# Patient Record
Sex: Female | Born: 1965 | Race: White | Hispanic: No | Smoking: Never smoker
Health system: Southern US, Community
[De-identification: ages and names within clinical notes are randomized; demographics above are authoritative.]

## PROBLEM LIST (undated history)

## (undated) DIAGNOSIS — F32A Depression, unspecified: Secondary | ICD-10-CM

## (undated) DIAGNOSIS — F410 Panic disorder [episodic paroxysmal anxiety] without agoraphobia: Secondary | ICD-10-CM

## (undated) DIAGNOSIS — F419 Anxiety disorder, unspecified: Secondary | ICD-10-CM

## (undated) DIAGNOSIS — F329 Major depressive disorder, single episode, unspecified: Secondary | ICD-10-CM

## (undated) DIAGNOSIS — J189 Pneumonia, unspecified organism: Secondary | ICD-10-CM

## (undated) DIAGNOSIS — H548 Legal blindness, as defined in USA: Secondary | ICD-10-CM

## (undated) DIAGNOSIS — I10 Essential (primary) hypertension: Secondary | ICD-10-CM

## (undated) HISTORY — PX: TUBAL LIGATION: SHX77

## (undated) HISTORY — DX: Panic disorder (episodic paroxysmal anxiety): F41.0

## (undated) HISTORY — DX: Anxiety disorder, unspecified: F41.9

## (undated) HISTORY — DX: Major depressive disorder, single episode, unspecified: F32.9

## (undated) HISTORY — DX: Essential (primary) hypertension: I10

## (undated) HISTORY — DX: Depression, unspecified: F32.A

## (undated) HISTORY — DX: Legal blindness, as defined in USA: H54.8

## (undated) HISTORY — PX: COLONOSCOPY: SHX174

---

## 1989-09-24 HISTORY — PX: TUBAL LIGATION: SHX77

## 2005-11-15 ENCOUNTER — Encounter: Admission: RE | Admit: 2005-11-15 | Discharge: 2005-11-15 | Payer: Self-pay | Admitting: Internal Medicine

## 2007-06-09 ENCOUNTER — Encounter: Admission: RE | Admit: 2007-06-09 | Discharge: 2007-06-09 | Payer: Self-pay | Admitting: Internal Medicine
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
## 2017-10-24 ENCOUNTER — Ambulatory Visit (INDEPENDENT_AMBULATORY_CARE_PROVIDER_SITE_OTHER): Payer: BLUE CROSS/BLUE SHIELD | Admitting: Psychiatry

## 2017-10-24 ENCOUNTER — Encounter (HOSPITAL_COMMUNITY): Payer: Self-pay | Admitting: Psychiatry

## 2017-10-24 ENCOUNTER — Encounter (INDEPENDENT_AMBULATORY_CARE_PROVIDER_SITE_OTHER): Payer: Self-pay

## 2017-10-24 VITALS — BP 122/84 | HR 79 | Ht 62.0 in | Wt 182.0 lb

## 2017-10-24 DIAGNOSIS — Z91419 Personal history of unspecified adult abuse: Secondary | ICD-10-CM | POA: Diagnosis not present

## 2017-10-24 DIAGNOSIS — F410 Panic disorder [episodic paroxysmal anxiety] without agoraphobia: Secondary | ICD-10-CM

## 2017-10-24 DIAGNOSIS — F411 Generalized anxiety disorder: Secondary | ICD-10-CM | POA: Diagnosis not present

## 2017-10-24 DIAGNOSIS — H359 Unspecified retinal disorder: Secondary | ICD-10-CM

## 2017-10-24 DIAGNOSIS — Z811 Family history of alcohol abuse and dependence: Secondary | ICD-10-CM

## 2017-10-24 DIAGNOSIS — I10 Essential (primary) hypertension: Secondary | ICD-10-CM | POA: Diagnosis not present

## 2017-10-24 DIAGNOSIS — F331 Major depressive disorder, recurrent, moderate: Secondary | ICD-10-CM

## 2017-10-24 DIAGNOSIS — Z79899 Other long term (current) drug therapy: Secondary | ICD-10-CM

## 2017-10-24 HISTORY — DX: Major depressive disorder, recurrent, moderate: F33.1

## 2017-10-24 HISTORY — DX: Panic disorder (episodic paroxysmal anxiety): F41.0

## 2017-10-24 HISTORY — DX: Generalized anxiety disorder: F41.1

## 2017-10-24 MED ORDER — DIAZEPAM 5 MG PO TABS: 5.0000 mg | ORAL_TABLET | Freq: Every day | ORAL | 0 refills | Status: DC | PRN

## 2017-10-24 MED ORDER — SERTRALINE HCL 25 MG PO TABS: ORAL_TABLET | ORAL | 0 refills | Status: DC

## 2017-10-24 NOTE — Patient Instructions (Signed)
1. Start sertraline 25 mg daily for one week, then 50 mg daily 2. Continue valium 5 mg daily as needed for anxiety  3. Return to clinic in one month for 30 mins 4. Referral to therapy

## 2017-11-11 ENCOUNTER — Other Ambulatory Visit (HOSPITAL_COMMUNITY): Payer: Self-pay | Admitting: Psychiatry

## 2017-11-11 ENCOUNTER — Telehealth (HOSPITAL_COMMUNITY): Payer: Self-pay | Admitting: *Deleted

## 2017-11-11 MED ORDER — SERTRALINE HCL 50 MG PO TABS: 50.0000 mg | ORAL_TABLET | Freq: Every day | ORAL | 0 refills | Status: DC

## 2017-11-11 NOTE — Telephone Encounter (Signed)
ordered

## 2017-11-11 NOTE — Telephone Encounter (Signed)
Dr Modesta Messing Patient called requesting refill on Zoloft 50 mg next appointment 11/21/17
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
## 2017-11-21 ENCOUNTER — Ambulatory Visit (INDEPENDENT_AMBULATORY_CARE_PROVIDER_SITE_OTHER): Payer: BLUE CROSS/BLUE SHIELD | Admitting: Psychiatry

## 2017-11-21 ENCOUNTER — Encounter (HOSPITAL_COMMUNITY): Payer: Self-pay | Admitting: Psychiatry

## 2017-11-21 VITALS — BP 138/86 | HR 79 | Ht 62.0 in | Wt 175.0 lb

## 2017-11-21 DIAGNOSIS — F411 Generalized anxiety disorder: Secondary | ICD-10-CM

## 2017-11-21 DIAGNOSIS — Z811 Family history of alcohol abuse and dependence: Secondary | ICD-10-CM | POA: Diagnosis not present

## 2017-11-21 DIAGNOSIS — G4700 Insomnia, unspecified: Secondary | ICD-10-CM

## 2017-11-21 DIAGNOSIS — F410 Panic disorder [episodic paroxysmal anxiety] without agoraphobia: Secondary | ICD-10-CM | POA: Diagnosis not present

## 2017-11-21 DIAGNOSIS — R450 Nervousness: Secondary | ICD-10-CM

## 2017-11-21 DIAGNOSIS — F331 Major depressive disorder, recurrent, moderate: Secondary | ICD-10-CM | POA: Diagnosis not present

## 2017-11-21 MED ORDER — SERTRALINE HCL 100 MG PO TABS: 100.0000 mg | ORAL_TABLET | Freq: Every day | ORAL | 0 refills | Status: DC

## 2017-11-21 NOTE — Patient Instructions (Signed)
1. Increase sertraline 100 mg daily  2. Continue valium 5 mg daily as needed for anxiety 3. Return to clinic in one month for 30 mins

## 2017-11-22 ENCOUNTER — Ambulatory Visit (HOSPITAL_COMMUNITY): Payer: Self-pay | Admitting: Psychiatry

## 2017-12-04 ENCOUNTER — Encounter (HOSPITAL_COMMUNITY): Payer: Self-pay | Admitting: Psychiatry

## 2017-12-04 ENCOUNTER — Ambulatory Visit (INDEPENDENT_AMBULATORY_CARE_PROVIDER_SITE_OTHER): Payer: BLUE CROSS/BLUE SHIELD | Admitting: Psychiatry

## 2017-12-04 DIAGNOSIS — F411 Generalized anxiety disorder: Secondary | ICD-10-CM | POA: Diagnosis not present

## 2017-12-05 ENCOUNTER — Encounter (HOSPITAL_COMMUNITY): Payer: Self-pay | Admitting: Psychiatry
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
## 2017-12-11 ENCOUNTER — Ambulatory Visit (INDEPENDENT_AMBULATORY_CARE_PROVIDER_SITE_OTHER): Payer: BLUE CROSS/BLUE SHIELD | Admitting: Psychiatry

## 2017-12-11 ENCOUNTER — Encounter (HOSPITAL_COMMUNITY): Payer: Self-pay | Admitting: Psychiatry

## 2017-12-11 DIAGNOSIS — F411 Generalized anxiety disorder: Secondary | ICD-10-CM
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
## 2018-01-07 ENCOUNTER — Telehealth (HOSPITAL_COMMUNITY): Payer: Self-pay | Admitting: *Deleted

## 2018-01-07 ENCOUNTER — Other Ambulatory Visit (HOSPITAL_COMMUNITY): Payer: Self-pay | Admitting: Psychiatry

## 2018-01-07 MED ORDER — SERTRALINE HCL 100 MG PO TABS: 100.0000 mg | ORAL_TABLET | Freq: Every day | ORAL | 0 refills | Status: DC

## 2018-01-07 MED ORDER — SERTRALINE HCL 100 MG PO TABS
100.0000 mg | ORAL_TABLET | Freq: Every day | ORAL | 0 refills | Status: DC
Start: 2018-01-07 — End: 2018-02-03

## 2018-01-07 NOTE — Telephone Encounter (Signed)
Dr Harrington Challenger Dr Modesta Messing patient requesting refill on Zoloft. 2 appointment therapy with Peggy 01/09/18 & 01/22/18

## 2018-01-07 NOTE — Telephone Encounter (Signed)
sent 

## 2018-01-09 ENCOUNTER — Ambulatory Visit (INDEPENDENT_AMBULATORY_CARE_PROVIDER_SITE_OTHER): Payer: BLUE CROSS/BLUE SHIELD | Admitting: Psychiatry

## 2018-01-09 ENCOUNTER — Encounter (HOSPITAL_COMMUNITY): Payer: Self-pay | Admitting: Psychiatry

## 2018-01-09 DIAGNOSIS — F331 Major depressive disorder, recurrent, moderate: Secondary | ICD-10-CM

## 2018-01-09 DIAGNOSIS — F411 Generalized anxiety disorder: Secondary | ICD-10-CM
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
## 2018-01-22 ENCOUNTER — Ambulatory Visit (HOSPITAL_COMMUNITY): Payer: BLUE CROSS/BLUE SHIELD | Admitting: Psychiatry
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
## 2018-02-03 ENCOUNTER — Ambulatory Visit (INDEPENDENT_AMBULATORY_CARE_PROVIDER_SITE_OTHER): Payer: Self-pay | Admitting: Psychiatry

## 2018-02-03 VITALS — BP 117/78 | HR 76 | Ht 62.0 in | Wt 180.0 lb

## 2018-02-03 DIAGNOSIS — Z811 Family history of alcohol abuse and dependence: Secondary | ICD-10-CM

## 2018-02-03 DIAGNOSIS — R450 Nervousness: Secondary | ICD-10-CM

## 2018-02-03 DIAGNOSIS — G4700 Insomnia, unspecified: Secondary | ICD-10-CM

## 2018-02-03 DIAGNOSIS — Z91411 Personal history of adult psychological abuse: Secondary | ICD-10-CM

## 2018-02-03 DIAGNOSIS — F410 Panic disorder [episodic paroxysmal anxiety] without agoraphobia: Secondary | ICD-10-CM

## 2018-02-03 DIAGNOSIS — G4710 Hypersomnia, unspecified: Secondary | ICD-10-CM

## 2018-02-03 DIAGNOSIS — F411 Generalized anxiety disorder: Secondary | ICD-10-CM

## 2018-02-03 DIAGNOSIS — F331 Major depressive disorder, recurrent, moderate: Secondary | ICD-10-CM

## 2018-02-03 DIAGNOSIS — Z818 Family history of other mental and behavioral disorders: Secondary | ICD-10-CM

## 2018-02-03 MED ORDER — SERTRALINE HCL 100 MG PO TABS: 150.0000 mg | ORAL_TABLET | Freq: Every day | ORAL | 0 refills | Status: DC

## 2018-02-03 NOTE — Patient Instructions (Signed)
1. Increase sertraline 150 mg daily  2. Return to clinic in one month for 30 mins 

## 2018-02-28 ENCOUNTER — Ambulatory Visit (HOSPITAL_COMMUNITY): Payer: Self-pay | Admitting: Psychiatry
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
## 2018-03-06 ENCOUNTER — Ambulatory Visit (INDEPENDENT_AMBULATORY_CARE_PROVIDER_SITE_OTHER): Payer: Self-pay | Admitting: Psychiatry

## 2018-03-06 VITALS — BP 141/85 | HR 97 | Ht 62.0 in | Wt 178.0 lb

## 2018-03-06 DIAGNOSIS — F331 Major depressive disorder, recurrent, moderate: Secondary | ICD-10-CM

## 2018-03-06 DIAGNOSIS — F410 Panic disorder [episodic paroxysmal anxiety] without agoraphobia: Secondary | ICD-10-CM

## 2018-03-06 DIAGNOSIS — F411 Generalized anxiety disorder: Secondary | ICD-10-CM

## 2018-03-06 MED ORDER — SERTRALINE HCL 100 MG PO TABS: 150.0000 mg | ORAL_TABLET | Freq: Every day | ORAL | 0 refills | Status: DC

## 2018-03-06 NOTE — Patient Instructions (Signed)
1. Continue sertraline 150 mg daily  2. Return to clinic in three months for 30 mins

## 2018-03-12 ENCOUNTER — Ambulatory Visit (HOSPITAL_COMMUNITY): Payer: Self-pay | Admitting: Psychiatry

## 2018-03-26 ENCOUNTER — Ambulatory Visit (INDEPENDENT_AMBULATORY_CARE_PROVIDER_SITE_OTHER): Payer: Self-pay | Admitting: Psychiatry

## 2018-03-26 DIAGNOSIS — F411 Generalized anxiety disorder: Secondary | ICD-10-CM

## 2018-03-26 DIAGNOSIS — F331 Major depressive disorder, recurrent, moderate: Secondary | ICD-10-CM
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
## 2018-04-09 ENCOUNTER — Ambulatory Visit (INDEPENDENT_AMBULATORY_CARE_PROVIDER_SITE_OTHER): Payer: Self-pay | Admitting: Psychiatry

## 2018-04-09 DIAGNOSIS — F411 Generalized anxiety disorder: Secondary | ICD-10-CM
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
## 2018-05-23 ENCOUNTER — Ambulatory Visit (INDEPENDENT_AMBULATORY_CARE_PROVIDER_SITE_OTHER): Payer: Self-pay | Admitting: Psychiatry

## 2018-05-23 DIAGNOSIS — F411 Generalized anxiety disorder: Secondary | ICD-10-CM

## 2018-05-23 DIAGNOSIS — F331 Major depressive disorder, recurrent, moderate: Secondary | ICD-10-CM
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
## 2018-05-27 ENCOUNTER — Ambulatory Visit (HOSPITAL_COMMUNITY): Payer: Self-pay | Admitting: Psychiatry

## 2018-06-04 ENCOUNTER — Ambulatory Visit (INDEPENDENT_AMBULATORY_CARE_PROVIDER_SITE_OTHER): Payer: Self-pay | Admitting: Psychiatry

## 2018-06-04 DIAGNOSIS — F411 Generalized anxiety disorder: Secondary | ICD-10-CM

## 2018-06-04 DIAGNOSIS — F331 Major depressive disorder, recurrent, moderate: Secondary | ICD-10-CM
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
## 2018-06-10 ENCOUNTER — Ambulatory Visit (HOSPITAL_COMMUNITY): Payer: Self-pay | Admitting: Psychiatry
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
## 2018-06-17 ENCOUNTER — Ambulatory Visit (HOSPITAL_COMMUNITY): Payer: Self-pay | Admitting: Psychiatry

## 2018-06-24 ENCOUNTER — Encounter (HOSPITAL_COMMUNITY): Payer: Self-pay | Admitting: Psychiatry

## 2018-06-24 ENCOUNTER — Ambulatory Visit (INDEPENDENT_AMBULATORY_CARE_PROVIDER_SITE_OTHER): Payer: Self-pay | Admitting: Psychiatry

## 2018-06-24 VITALS — BP 113/77 | HR 103 | Ht 62.0 in | Wt 186.0 lb

## 2018-06-24 DIAGNOSIS — F411 Generalized anxiety disorder: Secondary | ICD-10-CM

## 2018-06-24 DIAGNOSIS — F331 Major depressive disorder, recurrent, moderate: Secondary | ICD-10-CM

## 2018-06-24 DIAGNOSIS — F410 Panic disorder [episodic paroxysmal anxiety] without agoraphobia: Secondary | ICD-10-CM

## 2018-06-24 MED ORDER — SERTRALINE HCL 100 MG PO TABS: 200.0000 mg | ORAL_TABLET | Freq: Every day | ORAL | 0 refills | Status: DC

## 2018-06-24 NOTE — Patient Instructions (Signed)
1. Increase sertraline 200 mg daily  2. Return to clinic in two months for 30 mins
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
## 2018-07-04 ENCOUNTER — Ambulatory Visit (HOSPITAL_COMMUNITY): Payer: Self-pay | Admitting: Psychiatry
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
## 2018-07-31 ENCOUNTER — Ambulatory Visit (INDEPENDENT_AMBULATORY_CARE_PROVIDER_SITE_OTHER): Payer: Self-pay | Admitting: Psychiatry

## 2018-07-31 VITALS — BP 142/81 | HR 91 | Ht 62.0 in | Wt 181.0 lb

## 2018-07-31 DIAGNOSIS — F331 Major depressive disorder, recurrent, moderate: Secondary | ICD-10-CM

## 2018-07-31 NOTE — Patient Instructions (Addendum)
1. Continue sertraline 200 mg daily  2. Return to clinic in one month for 30 mins

## 2018-08-08 ENCOUNTER — Ambulatory Visit (INDEPENDENT_AMBULATORY_CARE_PROVIDER_SITE_OTHER): Payer: Self-pay | Admitting: Psychiatry

## 2018-08-08 ENCOUNTER — Encounter (HOSPITAL_COMMUNITY): Payer: Self-pay | Admitting: Psychiatry

## 2018-08-08 DIAGNOSIS — F331 Major depressive disorder, recurrent, moderate: Secondary | ICD-10-CM

## 2018-08-08 DIAGNOSIS — F411 Generalized anxiety disorder: Secondary | ICD-10-CM
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
## 2018-08-26 ENCOUNTER — Ambulatory Visit (HOSPITAL_COMMUNITY): Payer: Self-pay | Admitting: Psychiatry

## 2018-08-27 ENCOUNTER — Ambulatory Visit (HOSPITAL_COMMUNITY): Payer: Self-pay | Admitting: Psychiatry

## 2018-08-28 ENCOUNTER — Telehealth (HOSPITAL_COMMUNITY): Payer: Self-pay | Admitting: Psychiatry
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
## 2018-08-29 ENCOUNTER — Encounter (HOSPITAL_COMMUNITY): Payer: Self-pay | Admitting: Psychiatry

## 2018-08-29 ENCOUNTER — Ambulatory Visit (INDEPENDENT_AMBULATORY_CARE_PROVIDER_SITE_OTHER): Payer: Self-pay | Admitting: Psychiatry

## 2018-08-29 VITALS — BP 144/80 | HR 74 | Ht 62.0 in | Wt 182.0 lb

## 2018-08-29 DIAGNOSIS — F331 Major depressive disorder, recurrent, moderate: Secondary | ICD-10-CM

## 2018-08-29 DIAGNOSIS — F410 Panic disorder [episodic paroxysmal anxiety] without agoraphobia: Secondary | ICD-10-CM

## 2018-08-29 DIAGNOSIS — G4700 Insomnia, unspecified: Secondary | ICD-10-CM

## 2018-08-29 DIAGNOSIS — F419 Anxiety disorder, unspecified: Secondary | ICD-10-CM

## 2018-08-29 MED ORDER — SERTRALINE HCL 100 MG PO TABS: 200.0000 mg | ORAL_TABLET | Freq: Every day | ORAL | 0 refills | Status: DC

## 2018-08-29 NOTE — Patient Instructions (Signed)
1. Continue sertraline 200 mg daily  2.Return to clinic in three months for 30 mins

## 2018-09-03 ENCOUNTER — Telehealth (HOSPITAL_COMMUNITY): Payer: Self-pay | Admitting: Psychiatry

## 2018-09-03 NOTE — Telephone Encounter (Signed)
Could you contact the patient, and inform her about [facility], or [facility] in [place] for potential therapy (they might use sliding scale).

## 2018-09-03 NOTE — Telephone Encounter (Signed)
Patient mentioned to me that she may use her PCP to order her med's due to insurance issues

## 2018-09-03 NOTE — Telephone Encounter (Signed)
Noted, thanks!

## 2018-09-10 ENCOUNTER — Ambulatory Visit (INDEPENDENT_AMBULATORY_CARE_PROVIDER_SITE_OTHER): Payer: Self-pay | Admitting: Psychiatry

## 2018-09-10 DIAGNOSIS — F411 Generalized anxiety disorder: Secondary | ICD-10-CM

## 2018-09-10 DIAGNOSIS — F331 Major depressive disorder, recurrent, moderate: Secondary | ICD-10-CM
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
## 2018-10-01 ENCOUNTER — Ambulatory Visit (HOSPITAL_COMMUNITY): Payer: Self-pay | Admitting: Psychiatry

## 2018-10-15 ENCOUNTER — Ambulatory Visit (HOSPITAL_COMMUNITY): Payer: Self-pay | Admitting: Psychiatry

## 2018-10-29 ENCOUNTER — Ambulatory Visit (HOSPITAL_COMMUNITY): Payer: Self-pay | Admitting: Psychiatry

## 2018-11-04 ENCOUNTER — Encounter (HOSPITAL_COMMUNITY): Payer: Self-pay | Admitting: Psychiatry

## 2018-11-12 ENCOUNTER — Ambulatory Visit (HOSPITAL_COMMUNITY): Payer: Self-pay | Admitting: Psychiatry
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
## 2018-11-28 ENCOUNTER — Ambulatory Visit (HOSPITAL_COMMUNITY): Payer: Self-pay | Admitting: Psychiatry

## 2019-07-22 ENCOUNTER — Other Ambulatory Visit: Payer: Self-pay | Admitting: *Deleted

## 2019-07-22 DIAGNOSIS — Z20822 Contact with and (suspected) exposure to covid-19: Secondary | ICD-10-CM

## 2019-07-23 LAB — NOVEL CORONAVIRUS, NAA: SARS-CoV-2, NAA: NOT DETECTED

## 2019-07-24 ENCOUNTER — Telehealth: Payer: Self-pay | Admitting: Internal Medicine

## 2019-07-24 NOTE — Telephone Encounter (Signed)
Patient is calling to receive her negative COVID test results. Patient expressed understanding. 

## 2020-02-04 ENCOUNTER — Ambulatory Visit
Admission: EM | Admit: 2020-02-04 | Discharge: 2020-02-04 | Disposition: A | Discharge status: Home / Self Care | Payer: Self-pay

## 2020-02-04 ENCOUNTER — Other Ambulatory Visit: Payer: Self-pay

## 2020-02-04 DIAGNOSIS — Z4802 Encounter for removal of sutures: Secondary | ICD-10-CM

## 2020-02-04 NOTE — ED Triage Notes (Signed)
Pt here for stitch removal from right eye. One suture removed

## 2020-05-09 ENCOUNTER — Other Ambulatory Visit: Payer: Self-pay

## 2020-05-09 ENCOUNTER — Ambulatory Visit (INDEPENDENT_AMBULATORY_CARE_PROVIDER_SITE_OTHER): Payer: Medicaid Other

## 2020-05-09 ENCOUNTER — Ambulatory Visit
Admission: EM | Admit: 2020-05-09 | Discharge: 2020-05-09 | Disposition: A | Discharge status: Home / Self Care | Payer: Medicaid Other

## 2020-05-09 DIAGNOSIS — R05 Cough: Secondary | ICD-10-CM | POA: Diagnosis not present

## 2020-05-09 DIAGNOSIS — R059 Cough, unspecified: Secondary | ICD-10-CM

## 2020-05-09 DIAGNOSIS — J189 Pneumonia, unspecified organism: Secondary | ICD-10-CM

## 2020-05-09 DIAGNOSIS — R9389 Abnormal findings on diagnostic imaging of other specified body structures: Secondary | ICD-10-CM

## 2020-05-09 MED ORDER — AMOXICILLIN 500 MG PO CAPS: 1000.0000 mg | ORAL_CAPSULE | Freq: Three times a day (TID) | ORAL | 0 refills | Status: DC

## 2020-05-09 MED ORDER — AZITHROMYCIN 250 MG PO TABS: 250.0000 mg | ORAL_TABLET | Freq: Every day | ORAL | 0 refills | Status: DC

## 2020-05-09 NOTE — Discharge Instructions (Addendum)
Unable to rule out cardiac disease or blood clot in urgent care setting.  Offered patient further evaluation and management in the ED.  Patient declines at this time and would like to try outpatient therapy first.  Aware of the risk associated with this decision including missed diagnosis, organ damage, organ failure, and/or death.  Patient aware and in agreement.     X-rays showed a pneumonia Amoxicillin and azithromycin prescribed. Take as prescribed and to completion.   Follow up with PCP if symptoms persist Return or go to ER if you have any new or worsening symptoms fever, chills, nausea, vomiting, cough, chest pain, SOB, etc..Marland Kitchen

## 2020-05-09 NOTE — ED Triage Notes (Signed)
Pt presents with c/o cough for past 2 months , wants chest x ray. Pt also states she has had intermittent

## 2020-05-09 NOTE — ED Provider Notes (Signed)
[facility]   [id number] 05/09/20 Arrival Time: 1841   CC: CHEST PAIN  SUBJECTIVE:  Bonnie Warren is a 54 y.o. female who presents with complaint of stabbing LT sided chest pain x 1 day.  Reports chronic nonproductive cough x 2 months.  Chest pain is diffuse about the middle and left side.  Describes as worsening, that is intermittent (unable to quantified) and stabbing in character.  Denies alleviating factors.  Denies aggravating factors.  Denies radiating symptoms.  Reports previous symptoms in the past with pleurisy.  Denies fever, chills, lightheadedness, dizziness, palpitations, tachycardia, nausea, vomiting, abdominal pain, changes in bowel or bladder habits.  Patient is legally blind, has PTSD, and does not have adequate home care.    ROS: As per HPI.  All other pertinent ROS negative.    Past Medical History:  Diagnosis Date  . Anxiety   . Depression   . Hypertension   . Panic attacks    Past Surgical History:  Procedure Laterality Date  . TUBAL LIGATION     No Known Allergies No current facility-administered medications on file prior to encounter.   Current Outpatient Medications on File Prior to Encounter  Medication Sig Dispense Refill  . diazepam (VALIUM) 5 MG tablet Take 1 tablet (5 mg total) by mouth daily as needed for anxiety. 30 tablet 0  . escitalopram (LEXAPRO) 5 MG tablet Take 5 mg by mouth at bedtime.    Marland Kitchen LISINOPRIL PO Take by mouth.    . [DISCONTINUED] sertraline (ZOLOFT) 100 MG tablet Take 2 tablets (200 mg total) by mouth daily. 180 tablet 0   Social History   Socioeconomic History  . Marital status: Married    Spouse name: Not on file  . Number of children: Not on file  . Years of education: Not on file  . Highest education level: Not on file  Occupational History  . Not on file  Tobacco Use  . Smoking status: Never Smoker  . Smokeless tobacco: Never Used  Substance and Sexual Activity  . Alcohol use: Not on file    Comment:  occasional, social, every 3 months  . Drug use: No  . Sexual activity: Never  Other Topics Concern  . Not on file  Social History Narrative  . Not on file   Social Determinants of Health   Financial Resource Strain:   . Difficulty of Paying Living Expenses:   Food Insecurity:   . Worried About Charity fundraiser in the Last Year:   . Arboriculturist in the Last Year:   Transportation Needs:   . Film/video editor (Medical):   Marland Kitchen Lack of Transportation (Non-Medical):   Physical Activity:   . Days of Exercise per Week:   . Minutes of Exercise per Session:   Stress:   . Feeling of Stress :   Social Connections:   . Frequency of Communication with Friends and Family:   . Frequency of Social Gatherings with Friends and Family:   . Attends Religious Services:   . Active Member of Clubs or Organizations:   . Attends Archivist Meetings:   Marland Kitchen Marital Status:   Intimate Partner Violence:   . Fear of Current or Ex-Partner:   . Emotionally Abused:   Marland Kitchen Physically Abused:   . Sexually Abused:    Family History  Problem Relation Age of Onset  . Alcohol abuse Maternal Grandfather   . Depression Maternal Grandmother   . Hypertension Mother  OBJECTIVE:  Vitals:   05/09/20 1933  BP: 128/85  Pulse: 67  Resp: 18  Temp: 98.2 F (36.8 C)  SpO2: 98%    General appearance: alert; no distress Eyes: PERRLA; EOMI; conjunctiva normal HENT: normocephalic; atraumatic; EACs clear, TMs pearly gray; oropharynx clear Neck: supple Lungs: clear to auscultation bilaterally without adventitious breath sounds; difficult to assess due to patient talking Heart: regular rate and rhythm.   Extremities: no cyanosis or edema; symmetrical with no gross deformities Skin: warm and dry Psychological: alert and cooperative; anxious mood and affect; tearful during examination  DIAGNOSTIC STUDIES:   DG Chest 2 View  Result Date: 05/09/2020 CLINICAL DATA:  Cough for 2 months. EXAM:  CHEST - 2 VIEW COMPARISON:  None. FINDINGS: The cardiomediastinal contours are normal. There is peribronchial thickening. Questionable vague patchy opacity in the left mid lung likely localized to the lingula on the lateral view. Pulmonary vasculature is normal. No pleural effusion or pneumothorax. No acute osseous abnormalities are seen. IMPRESSION: 1. Questionable vague patchy opacity in the left mid lung, likely localized to the lingula on the lateral view, may reflect pneumonia. Followup PA and lateral chest X-ray is recommended in 3-4 weeks after course of treatment to ensure resolution and exclude underlying malignancy. 2. Peribronchial thickening suggesting bronchitis or asthma. Electronically Signed   By: Keith Rake M.D.   On: 05/09/2020 19:50    X-rays concerning for pneumonia  I have reviewed the x-rays myself and the radiologist interpretation. I am in agreement with the radiologist interpretation.     ASSESSMENT & PLAN:  1. Cough   2. Abnormal chest x-ray   3. Pneumonia of left lung due to infectious organism, unspecified part of lung     Meds ordered this encounter  Medications  . amoxicillin (AMOXIL) 500 MG capsule    Sig: Take 2 capsules (1,000 mg total) by mouth 3 (three) times daily for 10 days.    Dispense:  60 capsule    Refill:  0    Order Specific Question:   Supervising Provider    Answer:   Raylene Everts [[id number]]  . azithromycin (ZITHROMAX) 250 MG tablet    Sig: Take 1 tablet (250 mg total) by mouth daily. Take first 2 tablets together, then 1 every day until finished.    Dispense:  6 tablet    Refill:  0    Order Specific Question:   Supervising Provider    Answer:   Raylene Everts [[id number]]   Unable to rule out cardiac disease or blood clot in urgent care setting.  Offered patient further evaluation and management in the ED.  Patient declines at this time and would like to try outpatient therapy first.  Aware of the risk associated with this  decision including missed diagnosis, organ damage, organ failure, and/or death.  Patient aware and in agreement.     X-rays showed a pneumonia Amoxicillin and azithromycin prescribed. Take as prescribed and to completion.   Follow up with PCP if symptoms persist Return or go to ER if you have any new or worsening symptoms fever, chills, nausea, vomiting, cough, chest pain, SOB, etc...    Lestine Box, PA-C 05/09/20 2014

## 2020-05-10 ENCOUNTER — Telehealth: Payer: Self-pay

## 2020-05-10 MED ORDER — AZITHROMYCIN 250 MG PO TABS: 250.0000 mg | ORAL_TABLET | Freq: Every day | ORAL | 0 refills | Status: DC

## 2020-05-10 MED ORDER — AMOXICILLIN 500 MG PO CAPS: 1000.0000 mg | ORAL_CAPSULE | Freq: Three times a day (TID) | ORAL | 0 refills | Status: AC

## 2020-05-11 ENCOUNTER — Ambulatory Visit
Admission: EM | Admit: 2020-05-11 | Discharge: 2020-05-11 | Disposition: A | Discharge status: Home / Self Care | Payer: Medicaid Other | Attending: Emergency Medicine | Admitting: Emergency Medicine

## 2020-05-11 ENCOUNTER — Other Ambulatory Visit: Payer: Self-pay

## 2020-05-11 DIAGNOSIS — Z1152 Encounter for screening for COVID-19: Secondary | ICD-10-CM

## 2020-05-11 NOTE — ED Triage Notes (Signed)
Pt only wants covid test , pt recently seen and treated for pneumonia

## 2020-05-12 ENCOUNTER — Other Ambulatory Visit: Payer: Self-pay | Admitting: Internal Medicine

## 2020-05-12 ENCOUNTER — Other Ambulatory Visit: Payer: Self-pay | Admitting: Nurse Practitioner

## 2020-05-12 ENCOUNTER — Ambulatory Visit
Admission: RE | Admit: 2020-05-12 | Discharge: 2020-05-12 | Disposition: A | Discharge status: Home / Self Care | Payer: Medicaid Other | Source: Ambulatory Visit | Attending: Nurse Practitioner | Admitting: Nurse Practitioner

## 2020-05-12 DIAGNOSIS — Z1231 Encounter for screening mammogram for malignant neoplasm of breast: Secondary | ICD-10-CM

## 2020-05-12 LAB — SARS-COV-2, NAA 2 DAY TAT

## 2020-05-12 LAB — NOVEL CORONAVIRUS, NAA: SARS-CoV-2, NAA: NOT DETECTED

## 2020-05-14 ENCOUNTER — Telehealth: Payer: Self-pay | Admitting: Family Medicine

## 2020-05-14 MED ORDER — FLUCONAZOLE 150 MG PO TABS: 150.0000 mg | ORAL_TABLET | Freq: Every day | ORAL | 0 refills | Status: DC

## 2020-05-14 MED ORDER — AZITHROMYCIN 250 MG PO TABS: ORAL_TABLET | ORAL | 0 refills | Status: DC

## 2020-05-14 NOTE — Telephone Encounter (Signed)
Resending z pac due to her loosing it.  Diflucan for yeast infection.

## 2020-05-16 ENCOUNTER — Other Ambulatory Visit: Payer: Self-pay | Admitting: Nurse Practitioner

## 2020-05-16 DIAGNOSIS — R928 Other abnormal and inconclusive findings on diagnostic imaging of breast: Secondary | ICD-10-CM

## 2020-05-18 ENCOUNTER — Other Ambulatory Visit: Payer: Self-pay | Admitting: Nurse Practitioner

## 2020-05-18 ENCOUNTER — Other Ambulatory Visit: Payer: Self-pay

## 2020-05-18 ENCOUNTER — Ambulatory Visit
Admission: RE | Admit: 2020-05-18 | Discharge: 2020-05-18 | Disposition: A | Discharge status: Home / Self Care | Payer: Medicaid Other | Source: Ambulatory Visit | Attending: Nurse Practitioner | Admitting: Nurse Practitioner

## 2020-05-18 DIAGNOSIS — R928 Other abnormal and inconclusive findings on diagnostic imaging of breast: Secondary | ICD-10-CM

## 2020-05-26 ENCOUNTER — Ambulatory Visit
Admission: RE | Admit: 2020-05-26 | Discharge: 2020-05-26 | Disposition: A | Discharge status: Home / Self Care | Payer: Medicaid Other | Source: Ambulatory Visit | Attending: Nurse Practitioner | Admitting: Nurse Practitioner

## 2020-05-26 ENCOUNTER — Other Ambulatory Visit: Payer: Self-pay

## 2020-05-26 DIAGNOSIS — R928 Other abnormal and inconclusive findings on diagnostic imaging of breast: Secondary | ICD-10-CM

## 2020-06-07 DIAGNOSIS — Z1211 Encounter for screening for malignant neoplasm of colon: Secondary | ICD-10-CM | POA: Insufficient documentation

## 2020-06-29 DIAGNOSIS — D122 Benign neoplasm of ascending colon: Secondary | ICD-10-CM | POA: Insufficient documentation

## 2020-07-11 ENCOUNTER — Ambulatory Visit
Admission: EM | Admit: 2020-07-11 | Discharge: 2020-07-11 | Disposition: A | Discharge status: Home / Self Care | Payer: Medicaid Other | Attending: Emergency Medicine | Admitting: Emergency Medicine

## 2020-07-11 ENCOUNTER — Other Ambulatory Visit: Payer: Self-pay

## 2020-07-11 ENCOUNTER — Encounter: Payer: Self-pay | Admitting: Emergency Medicine

## 2020-07-11 DIAGNOSIS — H5789 Other specified disorders of eye and adnexa: Secondary | ICD-10-CM | POA: Diagnosis not present

## 2020-07-11 DIAGNOSIS — H65193 Other acute nonsuppurative otitis media, bilateral: Secondary | ICD-10-CM

## 2020-07-11 MED ORDER — FLUTICASONE PROPIONATE 50 MCG/ACT NA SUSP: 1.0000 | Freq: Every day | NASAL | 0 refills | Status: DC

## 2020-07-11 MED ORDER — OFLOXACIN 0.3 % OP SOLN: 1.0000 [drp] | Freq: Four times a day (QID) | OPHTHALMIC | 0 refills | Status: DC

## 2020-07-11 MED ORDER — FLUTICASONE PROPIONATE 50 MCG/ACT NA SUSP: 1.0000 | Freq: Every day | NASAL | 0 refills | Status: AC

## 2020-07-11 NOTE — ED Triage Notes (Signed)
Observations: Patient states that she has busted blood vessel in right eye and ear pain fullness. no cough. says she needs eye antibiotics.

## 2020-07-11 NOTE — ED Provider Notes (Signed)
[facility]   [id number] 07/11/20 Arrival Time: 54  CC: Red eye  SUBJECTIVE:  Bonnie Warren is a 54 y.o. female who presented to the urgent care with a complaint of right eye redness for the past few days.  Denies a precipitating event, trauma, or close contacts with similar symptoms.  Has tried OTC eye drops Systane without relief.  Denies aggravating factors.  Denies similar symptoms in the past.  Denies fever, chills, nausea, vomiting, eye pain, painful eye movements, halos, discharge, itching, vision changes, double vision, FB sensation, periorbital erythema.     Denies contact lens use.    ROS: As per HPI.  All other pertinent ROS negative.     Past Medical History:  Diagnosis Date  . Anxiety   . Depression   . Hypertension   . Panic attacks    Past Surgical History:  Procedure Laterality Date  . TUBAL LIGATION     No Known Allergies No current facility-administered medications on file prior to encounter.   Current Outpatient Medications on File Prior to Encounter  Medication Sig Dispense Refill  . azithromycin (ZITHROMAX Z-PAK) 250 MG tablet As specified on pack. 6 tablet 0  . diazepam (VALIUM) 5 MG tablet Take 1 tablet (5 mg total) by mouth daily as needed for anxiety. 30 tablet 0  . escitalopram (LEXAPRO) 5 MG tablet Take 5 mg by mouth at bedtime.    . fluconazole (DIFLUCAN) 150 MG tablet Take 1 tablet (150 mg total) by mouth daily. 2 tablet 0  . LISINOPRIL PO Take by mouth.    . [DISCONTINUED] sertraline (ZOLOFT) 100 MG tablet Take 2 tablets (200 mg total) by mouth daily. 180 tablet 0   Social History   Socioeconomic History  . Marital status: Married    Spouse name: Not on file  . Number of children: Not on file  . Years of education: Not on file  . Highest education level: Not on file  Occupational History  . Not on file  Tobacco Use  . Smoking status: Never Smoker  . Smokeless tobacco: Never Used  Substance and Sexual Activity  .  Alcohol use: Not on file    Comment: occasional, social, every 3 months  . Drug use: No  . Sexual activity: Never  Other Topics Concern  . Not on file  Social History Narrative  . Not on file   Social Determinants of Health   Financial Resource Strain:   . Difficulty of Paying Living Expenses: Not on file  Food Insecurity:   . Worried About Charity fundraiser in the Last Year: Not on file  . Ran Out of Food in the Last Year: Not on file  Transportation Needs:   . Lack of Transportation (Medical): Not on file  . Lack of Transportation (Non-Medical): Not on file  Physical Activity:   . Days of Exercise per Week: Not on file  . Minutes of Exercise per Session: Not on file  Stress:   . Feeling of Stress : Not on file  Social Connections:   . Frequency of Communication with Friends and Family: Not on file  . Frequency of Social Gatherings with Friends and Family: Not on file  . Attends Religious Services: Not on file  . Active Member of Clubs or Organizations: Not on file  . Attends Archivist Meetings: Not on file  . Marital Status: Not on file  Intimate Partner Violence:   . Fear of Current or Ex-Partner: Not on  file  . Emotionally Abused: Not on file  . Physically Abused: Not on file  . Sexually Abused: Not on file   Family History  Problem Relation Age of Onset  . Alcohol abuse Maternal Grandfather   . Depression Maternal Grandmother   . Hypertension Mother     OBJECTIVE:    Visual Acuity  Right Eye Distance:   Left Eye Distance:   Bilateral Distance:    Right Eye Near:   Left Eye Near:    Bilateral Near:      Vitals:   07/11/20 1551  BP: (!) 146/92  Pulse: 88  Resp: 16  Temp: 98.8 F (37.1 C)  SpO2: 98%    Physical Exam Vitals and nursing note reviewed.  Constitutional:      General: She is not in acute distress.    Appearance: Normal appearance. She is normal weight. She is not ill-appearing, toxic-appearing or diaphoretic.  HENT:      Head: Normocephalic.     Right Ear: Ear canal and external ear normal. A middle ear effusion is present.     Left Ear: Ear canal and external ear normal. A middle ear effusion is present.  Eyes:     General: Lids are normal. Lids are everted, no foreign bodies appreciated. Vision grossly intact. Gaze aligned appropriately. No allergic shiner or visual field deficit.       Right eye: No foreign body, discharge or hordeolum.        Left eye: No foreign body, discharge or hordeolum.     Comments: right eye redness  Cardiovascular:     Rate and Rhythm: Normal rate and regular rhythm.     Pulses: Normal pulses.     Heart sounds: Normal heart sounds. No murmur heard.  No friction rub. No gallop.   Pulmonary:     Effort: Pulmonary effort is normal. No respiratory distress.     Breath sounds: Normal breath sounds. No stridor. No wheezing, rhonchi or rales.  Chest:     Chest wall: No tenderness.  Neurological:     Mental Status: She is alert and oriented to person, place, and time.       ASSESSMENT & PLAN:  1. Eye redness   2. Acute middle ear effusion, bilateral     Meds ordered this encounter  Medications  . ofloxacin (OCUFLOX) 0.3 % ophthalmic solution    Sig: Place 1 drop into the right eye 4 (four) times daily.    Dispense:  5 mL    Refill:  0  . fluticasone (FLONASE) 50 MCG/ACT nasal spray    Sig: Place 1 spray into both nostrils daily for 14 days.    Dispense:  16 g    Refill:  0     Discharge instructions  Use ofloxacin as prescribed and to completion Use OTC systane eye drops as needed for symptomatic relief Use OTC ibuprofen or tylenol as needed for pain relief Flonase was prescribed for bilateral middle ear effusion Return here or follow up with ophthamolgy if symptoms persists or worsen such as fever, chills, redness, swelling, eye pain, painful eye movements, vision changes, etc...   Reviewed expectations re: course of current medical issues. Questions  answered. Outlined signs and symptoms indicating need for more acute intervention. Patient verbalized understanding. After Visit Summary given.   Emerson Monte, [place] 07/11/20 1627

## 2020-07-11 NOTE — Discharge Instructions (Signed)
Use ofloxacin as prescribed and to completion Use OTC systane eye drops as needed for symptomatic relief Use OTC ibuprofen or tylenol as needed for pain relief Flonase was prescribed for bilateral middle ear effusion Return here or follow up with ophthamolgy if symptoms persists or worsen such as fever, chills, redness, swelling, eye pain, painful eye movements, vision changes, etc..Marland Kitchen

## 2020-07-23 ENCOUNTER — Ambulatory Visit
Admission: EM | Admit: 2020-07-23 | Discharge: 2020-07-23 | Disposition: A | Discharge status: Home / Self Care | Payer: Medicaid Other | Attending: Emergency Medicine | Admitting: Emergency Medicine

## 2020-07-23 ENCOUNTER — Ambulatory Visit (INDEPENDENT_AMBULATORY_CARE_PROVIDER_SITE_OTHER): Payer: Medicaid Other

## 2020-07-23 ENCOUNTER — Other Ambulatory Visit: Payer: Self-pay

## 2020-07-23 DIAGNOSIS — M25561 Pain in right knee: Secondary | ICD-10-CM | POA: Diagnosis not present

## 2020-07-23 DIAGNOSIS — S8991XA Unspecified injury of right lower leg, initial encounter: Secondary | ICD-10-CM

## 2020-07-23 MED ORDER — MELOXICAM 7.5 MG PO TABS: 7.5000 mg | ORAL_TABLET | Freq: Every day | ORAL | 0 refills | Status: DC

## 2020-07-23 NOTE — Discharge Instructions (Signed)
Continue conservative management of rest, ice, and elevation Ace bandage applied Take mobic as needed for pain relief (may cause abdominal discomfort, ulcers, and GI bleeds avoid taking with other NSAIDs) Follow up with PCP or orthopedist for further evaluation and management Return or go to the ER if you have any new or worsening symptoms (fever, chills, chest pain, redness, swelling, bruising, etc...)

## 2020-07-23 NOTE — ED Triage Notes (Signed)
Pt presents with right knee pain from fall 3 weeks ago, pt had a laceration that has healed on front of knee but still having pain

## 2020-07-23 NOTE — ED Provider Notes (Signed)
[place]   [id number] 07/23/20 Arrival Time: 5374  CC: RT knee pain  SUBJECTIVE: History from: patient. Bonnie Warren is a 54 y.o. female complains of RT knee pain and injury x 3 weeks.  Fall and landed on RT knee and RLE.  Localizes the pain to the RT knee.  Has tried OTC medications without relief.  Symptoms are made worse with weight-bearing.  Complains of healing wound to knee.  Denies fever, chills, erythema, ecchymosis, effusion, weakness, numbness and tingling.    ROS: As per HPI.  All other pertinent ROS negative.     Past Medical History:  Diagnosis Date  . Anxiety   . Depression   . Hypertension   . Panic attacks    Past Surgical History:  Procedure Laterality Date  . TUBAL LIGATION     No Known Allergies No current facility-administered medications on file prior to encounter.   Current Outpatient Medications on File Prior to Encounter  Medication Sig Dispense Refill  . diazepam (VALIUM) 5 MG tablet Take 1 tablet (5 mg total) by mouth daily as needed for anxiety. 30 tablet 0  . escitalopram (LEXAPRO) 5 MG tablet Take 5 mg by mouth at bedtime.    . fluconazole (DIFLUCAN) 150 MG tablet Take 1 tablet (150 mg total) by mouth daily. 2 tablet 0  . fluticasone (FLONASE) 50 MCG/ACT nasal spray Place 1 spray into both nostrils daily for 14 days. 16 g 0  . LISINOPRIL PO Take by mouth.    Marland Kitchen ofloxacin (OCUFLOX) 0.3 % ophthalmic solution Place 1 drop into the right eye 4 (four) times daily. 5 mL 0  . [DISCONTINUED] sertraline (ZOLOFT) 100 MG tablet Take 2 tablets (200 mg total) by mouth daily. 180 tablet 0   Social History   Socioeconomic History  . Marital status: Married    Spouse name: Not on file  . Number of children: Not on file  . Years of education: Not on file  . Highest education level: Not on file  Occupational History  . Not on file  Tobacco Use  . Smoking status: Never Smoker  . Smokeless tobacco: Never Used  Substance and Sexual  Activity  . Alcohol use: Not on file    Comment: occasional, social, every 3 months  . Drug use: No  . Sexual activity: Never  Other Topics Concern  . Not on file  Social History Narrative  . Not on file   Social Determinants of Health   Financial Resource Strain:   . Difficulty of Paying Living Expenses: Not on file  Food Insecurity:   . Worried About Charity fundraiser in the Last Year: Not on file  . Ran Out of Food in the Last Year: Not on file  Transportation Needs:   . Lack of Transportation (Medical): Not on file  . Lack of Transportation (Non-Medical): Not on file  Physical Activity:   . Days of Exercise per Week: Not on file  . Minutes of Exercise per Session: Not on file  Stress:   . Feeling of Stress : Not on file  Social Connections:   . Frequency of Communication with Friends and Family: Not on file  . Frequency of Social Gatherings with Friends and Family: Not on file  . Attends Religious Services: Not on file  . Active Member of Clubs or Organizations: Not on file  . Attends Archivist Meetings: Not on file  . Marital Status: Not on file  Intimate Partner Violence:   .  Fear of Current or Ex-Partner: Not on file  . Emotionally Abused: Not on file  . Physically Abused: Not on file  . Sexually Abused: Not on file   Family History  Problem Relation Age of Onset  . Alcohol abuse Maternal Grandfather   . Depression Maternal Grandmother   . Hypertension Mother     OBJECTIVE:  Vitals:   07/23/20 1354  BP: 119/80  Pulse: 79  Resp: 20  Temp: 98.5 F (36.9 C)  SpO2: 98%    General appearance: ALERT; in no acute distress.  Head: NCAT Lungs: Normal respiratory effort Musculoskeletal: RT knee Inspection: Healing abrasion to anterior knee Palpation: TTP over patellar tendon ROM: FROM active and passive Strength:  5/5 knee flexion, 5/5 knee extension Skin: warm and dry Neurologic: Ambulates with minimal difficulty Psychological: alert and  cooperative; normal mood and affect  DIAGNOSTIC STUDIES:  DG Knee Complete 4 Views Right  Result Date: 07/23/2020 CLINICAL DATA:  Right knee pain after fall 3 weeks ago. EXAM: RIGHT KNEE - COMPLETE 4+ VIEW COMPARISON:  None. FINDINGS: No evidence of fracture, dislocation, or joint effusion. No evidence of arthropathy or other focal bone abnormality. Soft tissues are unremarkable. IMPRESSION: Negative. Electronically Signed   By: Dorise Bullion III M.D   On: 07/23/2020 14:26    I have reviewed the x-rays myself and the radiologist interpretation. I am in agreement with the radiologist interpretation.     ASSESSMENT & PLAN:  1. Acute pain of right knee   2. Injury of right knee, initial encounter    Meds ordered this encounter  Medications  . meloxicam (MOBIC) 7.5 MG tablet    Sig: Take 1 tablet (7.5 mg total) by mouth daily.    Dispense:  30 tablet    Refill:  0    Order Specific Question:   Supervising Provider    Answer:   Raylene Everts [[id number]]   Continue conservative management of rest, ice, and elevation Ace bandage applied Take mobic as needed for pain relief (may cause abdominal discomfort, ulcers, and GI bleeds avoid taking with other NSAIDs) Follow up with PCP or orthopedist for further evaluation and management Return or go to the ER if you have any new or worsening symptoms (fever, chills, chest pain, redness, swelling, bruising, etc...)   Reviewed expectations re: course of current medical issues. Questions answered. Outlined signs and symptoms indicating need for more acute intervention. Patient verbalized understanding. After Visit Summary given.    Lestine Box, PA-C 07/23/20 1447

## 2021-06-01 ENCOUNTER — Encounter (HOSPITAL_COMMUNITY): Payer: Self-pay | Admitting: *Deleted

## 2021-06-01 ENCOUNTER — Other Ambulatory Visit: Payer: Self-pay

## 2021-06-01 ENCOUNTER — Emergency Department (HOSPITAL_COMMUNITY)
Admission: EM | Admit: 2021-06-01 | Discharge: 2021-06-01 | Disposition: A | Discharge status: Home / Self Care | Payer: Medicaid Other | Attending: Emergency Medicine | Admitting: Emergency Medicine

## 2021-06-01 ENCOUNTER — Emergency Department (HOSPITAL_COMMUNITY): Payer: Medicaid Other

## 2021-06-01 DIAGNOSIS — M545 Low back pain, unspecified: Secondary | ICD-10-CM | POA: Insufficient documentation

## 2021-06-01 DIAGNOSIS — R519 Headache, unspecified: Secondary | ICD-10-CM | POA: Insufficient documentation

## 2021-06-01 DIAGNOSIS — I10 Essential (primary) hypertension: Secondary | ICD-10-CM | POA: Insufficient documentation

## 2021-06-01 DIAGNOSIS — Z79899 Other long term (current) drug therapy: Secondary | ICD-10-CM | POA: Insufficient documentation

## 2021-06-01 MED ORDER — PREDNISONE 10 MG PO TABS: 30.0000 mg | ORAL_TABLET | Freq: Every day | ORAL | 0 refills | Status: AC

## 2021-06-01 MED ORDER — CYCLOBENZAPRINE HCL 10 MG PO TABS: 10.0000 mg | ORAL_TABLET | Freq: Two times a day (BID) | ORAL | 0 refills | Status: DC | PRN

## 2021-06-01 NOTE — ED Provider Notes (Signed)
[facility] EMERGENCY DEPARTMENT Provider Note   CSN: [id number] Arrival date & time: 06/01/21  1133     History Chief Complaint  Patient presents with   Bonnie Warren    [name] is a 55 y.o. female.  HPI  Patient with significant medical history of anxiety, depression, hypertension presents to the  emergency department with chief complaint of lower back pain.  Patient states about 2 months ago she was in [place] and unfortunately slipped on the floor falling onto her back.  She states that since then she has been having lower back pain, she states the pain stays mainly in her lower back, will occasionally radiate down her left leg, she denies paresthesias or weakness in lower extremities, denies urinary incontinency, retention, difficult bowel movements, denies any urinary  symptoms has no history of kidney stones.  She denies abdominal pain, nausea, vomiting, diarrhea, denies history of IV drug use, no associated fevers or chills.  States that she has been taking some over-the-counter pain medication without much relief, states the pain just been persistent worse with movement.  She also notes that she has been having a headache, headache for about 6 months, headache is intermittent, states is all over her head, no associated  paresthesias or weakness in upper or lower extremities, denies recent head trauma, not on anticoagulant, she currently does not have this headache at this time, last time she had headache was about 1 week ago.  Past Medical History:  Diagnosis Date   Anxiety    Depression    Hypertension    Panic attacks     Patient Active Problem List   Diagnosis Date Noted   GAD (generalized anxiety disorder) 10/24/2017   Moderate episode of recurrent major depressive disorder ([place]) 10/24/2017   Panic disorder 10/24/2017    Past Surgical History:  Procedure Laterality Date   TUBAL LIGATION       OB History   No obstetric history on file.     Family History   Problem Relation Age of Onset   Alcohol abuse Maternal Grandfather    Depression Maternal Grandmother    Hypertension Mother     Social History   Tobacco Use   Smoking status: Never   Smokeless tobacco: Never  Substance Use Topics   Alcohol use: Yes    Comment: occasional, social, every 3 months   Drug use: No    Home Medications Prior to Admission medications   Medication Sig Start Date End Date Taking? Authorizing Provider  cyclobenzaprine (FLEXERIL) 10 MG tablet Take 1 tablet (10 mg total) by mouth 2 (two) times daily as needed for muscle spasms. 06/01/21  Yes Marcello Fennel, PA-C  predniSONE (DELTASONE) 10 MG tablet Take 3 tablets (30 mg total) by mouth daily for 5 days. 06/01/21 06/06/21 Yes Marcello Fennel, PA-C  amoxicillin-clavulanate (AUGMENTIN) 875-125 MG tablet Take 1 tablet by mouth 2 (two) times daily. 05/01/21   [provider]  buPROPion (WELLBUTRIN XL) 150 MG 24 hr tablet Take 150 mg by mouth daily as needed. 05/10/21   [provider]  escitalopram (LEXAPRO) 5 MG tablet Take 5 mg by mouth at bedtime. 05/05/20   [provider]  fluticasone (FLONASE) 50 MCG/ACT nasal spray Place 1 spray into both nostrils daily for 14 days. 07/11/20 07/25/20  Avegno, Darrelyn Hillock, FNP  lisinopril-hydrochlorothiazide (ZESTORETIC) 20-12.5 MG tablet Take 1 tablet by mouth daily. 05/10/21   [provider]  PREMARIN vaginal cream SMARTSIG:1 Vaginal Daily PRN 01/13/21  [provider]  rosuvastatin (CRESTOR) 10 MG tablet Take 10 mg by mouth at bedtime. 05/09/21   [provider]  sertraline (ZOLOFT) 100 MG tablet Take 2 tablets (200 mg total) by mouth daily. 10/22/18 05/09/20  [name], MD    Allergies    Patient has no known allergies.  Review of Systems   Review of Systems  Constitutional:  Negative for chills and fever.  HENT:  Negative for congestion.   Respiratory:  Negative for shortness of breath.   Cardiovascular:   Negative for chest pain.  Gastrointestinal:  Negative for abdominal pain.  Genitourinary:  Negative for enuresis.  Musculoskeletal:  Positive for back pain.  Skin:  Negative for rash.  Neurological:  Negative for dizziness.  Hematological:  Does not bruise/bleed easily.   Physical Exam Updated Vital Signs BP 131/65   Pulse 84   Temp 98.2 F (36.8 C) (Oral)   Resp (!) 23   Ht '5\' 3"'$  (1.6 m)   Wt 85 kg   SpO2 98%   BMI 33.19 kg/m   Physical Exam Vitals and nursing note reviewed.  Constitutional:      General: She is not in acute distress.    Appearance: She is not ill-appearing.  HENT:     Head: Normocephalic and atraumatic.     Nose: No congestion.  Eyes:     Extraocular Movements: Extraocular movements intact.     Conjunctiva/sclera: Conjunctivae normal.     Pupils: Pupils are equal, round, and reactive to light.  Cardiovascular:     Rate and Rhythm: Normal rate and regular rhythm.     Pulses: Normal pulses.     Heart sounds: No murmur heard.   No friction rub. No gallop.  Pulmonary:     Effort: No respiratory distress.     Breath sounds: No wheezing, rhonchi or rales.  Abdominal:     Palpations: Abdomen is soft.     Tenderness: There is no abdominal tenderness. There is no right CVA tenderness or left CVA tenderness.  Musculoskeletal:     Comments: Spine was palpated she is slight tender to palpation along her lumbar spine, no step-off or deformities present.  Patient has full range of motion in the upper and lower extremities, 5 of 5 strength, neurovascular fully intact, she has 2+ patellor reflexes.   Skin:    General: Skin is warm and dry.  Neurological:     Mental Status: She is alert.     Comments: No facial asymmetry, no difficulty with word finding, able to follow two-step commands, not slurring of her words, no unilateral weakness present my exam.  Psychiatric:        Mood and Affect: Mood normal.    ED Results / Procedures / Treatments   Labs (all  labs ordered are listed, but only abnormal results are displayed) Labs Reviewed - No data to display  EKG None  Radiology No results found.  Procedures Procedures   Medications Ordered in ED Medications - No data to display  ED Course  I have reviewed the triage vital signs and the nursing notes.  Pertinent labs & imaging results that were available during my care of the patient were reviewed by me and considered in my medical decision making (see chart for details).    MDM Rules/Calculators/A&P                          Initial impression-patient presents with back pain  and a headache.  She is alert, does not appear acute stress, vital signs reassuring.  Will obtain CT lumbar spine for further evaluation.  Work-up-CT lumbar spine reveals no evidence of acute fracture on to the lumbar spine, does show L3-L4 with a bulging disc, mild central canal stenosis with bilateral subarticular narrowing, bilateral neural foraminal narrowing , also at L4-L5 bulging disc with mild central canal stenosis with bilateral subarticular narrowing, bilateral neural foraminal narrowing incidental finding of a 2.1 cm exophytic lesion arising from the left kidney recommend a contrast-enhanced abdominal MRI nonemergent for further evaluation.  Rule out-low suspicion for intracranial head bleed and/or CVA as she has no headache at this time, denies change in vision, paresthesia/ weakness upper/ lower extremities, denies recent head trauma, not on anticoagulants, there is no focal deficits present on exam.  Will defer CT imaging. I have low suspicion for spinal fracture or spinal cord abnormality as patient denies urinary incontinency, retention, difficulty with bowel movements, denies saddle paresthesias.  Spine was palpated there is no step-off, crepitus or gross deformities felt, patient had 5/5 strength, full range of motion, neurovascular fully intact in the lower extremities.  Imaging negative for fractures  or dislocation. . Low suspicion for septic arthritis as patient denies IV drug use, skin exam was performed no erythematous, edema or warm joints noted.  Low suspicion for aortic dissection or aneurysm as presentation is atypical of etiology, patient has low risk factors.   Plan-  Back pain-likely secondary to the bulging just seen on her imaging,  will have her follow-up with neurosurgery for further evaluation.  Also give her a short course of steroids. Headaches since resolved-likely cluster headaches and/or tension headaches we will have her follow-up with your PCP for further evaluation. Left kidney lesion-patient was made aware of this, we will have her follow-up with nonemergent contrasted MRI of abdomen for further evaluation.  Vital signs have remained stable, no indication for hospital admission.  Patient given at home care as well strict return precautions.  Patient verbalized that they understood agreed to said plan.  Final Clinical Impression(s) / ED Diagnoses Final diagnoses:  Lumbar pain    Rx / DC Orders ED Discharge Orders          Ordered    cyclobenzaprine (FLEXERIL) 10 MG tablet  2 times daily PRN        06/01/21 1517    predniSONE (DELTASONE) 10 MG tablet  Daily        06/01/21 1517             Marcello Fennel, PA-C 06/01/21 1521    Fredia Sorrow, MD 06/02/21 [phone]

## 2021-06-01 NOTE — ED Triage Notes (Signed)
[place] 2 months ago in [employer]. States she has pain in back and headaches

## 2021-06-01 NOTE — Discharge Instructions (Addendum)
Back pain-shows that you have 2 bulging disc, likely causing your  pain, recommend over-the-counter pain medications, stretching of the area, applying warm compress to the area.  Also recommend exercises to help stretch of your back.  Started you on steroids please take as prescribed, as well as muscle relaxers.  Headaches-please follow-up with your PCP for further evaluation, please stay hydrated, decrease stress, increased sleep and exercises to help decrease headaches.  Recommend over-the-counter pain medication as needed.  Left kidney cysts was seen on your imaging today please follow-up with your primary care doctor to schedule a outpatient MRI with and without contrast for further evaluation.  Come back to the emergency department if you develop chest pain, shortness of breath, severe abdominal pain, uncontrolled nausea, vomiting, diarrhea.

## 2021-06-08 ENCOUNTER — Other Ambulatory Visit (HOSPITAL_COMMUNITY): Payer: Self-pay | Admitting: Student

## 2021-06-08 DIAGNOSIS — R109 Unspecified abdominal pain: Secondary | ICD-10-CM

## 2021-06-08 DIAGNOSIS — R935 Abnormal findings on diagnostic imaging of other abdominal regions, including retroperitoneum: Secondary | ICD-10-CM

## 2021-06-20 ENCOUNTER — Ambulatory Visit (HOSPITAL_COMMUNITY)
Admission: RE | Admit: 2021-06-20 | Discharge: 2021-06-20 | Disposition: A | Discharge status: Home / Self Care | Payer: Medicaid Other | Source: Ambulatory Visit | Attending: Student | Admitting: Student

## 2021-06-20 ENCOUNTER — Other Ambulatory Visit: Payer: Self-pay

## 2021-06-20 DIAGNOSIS — R935 Abnormal findings on diagnostic imaging of other abdominal regions, including retroperitoneum: Secondary | ICD-10-CM | POA: Insufficient documentation

## 2021-06-20 DIAGNOSIS — R109 Unspecified abdominal pain: Secondary | ICD-10-CM

## 2021-06-20 MED ORDER — GADOBUTROL 1 MMOL/ML IV SOLN
7.0000 mL | Freq: Once | INTRAVENOUS | Status: AC | PRN
  Administered 2021-06-20: 7 mL via INTRAVENOUS

## 2021-06-26 ENCOUNTER — Other Ambulatory Visit (HOSPITAL_COMMUNITY): Payer: Self-pay | Admitting: Student

## 2021-06-26 ENCOUNTER — Other Ambulatory Visit (HOSPITAL_COMMUNITY): Payer: Self-pay | Admitting: Internal Medicine

## 2021-07-24 ENCOUNTER — Other Ambulatory Visit: Payer: Self-pay | Admitting: Internal Medicine

## 2021-07-24 DIAGNOSIS — Z1231 Encounter for screening mammogram for malignant neoplasm of breast: Secondary | ICD-10-CM

## 2021-08-28 ENCOUNTER — Ambulatory Visit
Admission: RE | Admit: 2021-08-28 | Discharge: 2021-08-28 | Disposition: A | Discharge status: Home / Self Care | Payer: Medicaid Other | Source: Ambulatory Visit | Attending: Internal Medicine | Admitting: Internal Medicine

## 2021-08-28 DIAGNOSIS — Z1231 Encounter for screening mammogram for malignant neoplasm of breast: Secondary | ICD-10-CM

## 2021-08-31 ENCOUNTER — Other Ambulatory Visit: Payer: Self-pay | Admitting: Internal Medicine

## 2021-08-31 DIAGNOSIS — R928 Other abnormal and inconclusive findings on diagnostic imaging of breast: Secondary | ICD-10-CM

## 2021-10-06 ENCOUNTER — Other Ambulatory Visit: Payer: Medicaid Other

## 2021-10-31 ENCOUNTER — Ambulatory Visit: Payer: Medicaid Other | Admitting: Physical Therapy

## 2021-12-14 ENCOUNTER — Ambulatory Visit
Admission: RE | Admit: 2021-12-14 | Discharge: 2021-12-14 | Disposition: A | Discharge status: Home / Self Care | Payer: Medicaid Other | Source: Ambulatory Visit | Attending: Internal Medicine | Admitting: Internal Medicine

## 2021-12-14 DIAGNOSIS — R928 Other abnormal and inconclusive findings on diagnostic imaging of breast: Secondary | ICD-10-CM

## 2021-12-29 ENCOUNTER — Emergency Department (HOSPITAL_COMMUNITY)
Admission: EM | Admit: 2021-12-29 | Discharge: 2021-12-29 | Disposition: A | Discharge status: Home / Self Care | Payer: Medicaid Other | Attending: Emergency Medicine | Admitting: Emergency Medicine

## 2021-12-29 ENCOUNTER — Encounter (HOSPITAL_COMMUNITY): Payer: Self-pay | Admitting: *Deleted

## 2021-12-29 DIAGNOSIS — S6992XA Unspecified injury of left wrist, hand and finger(s), initial encounter: Secondary | ICD-10-CM | POA: Diagnosis present

## 2021-12-29 DIAGNOSIS — W57XXXA Bitten or stung by nonvenomous insect and other nonvenomous arthropods, initial encounter: Secondary | ICD-10-CM | POA: Insufficient documentation

## 2021-12-29 DIAGNOSIS — S60862A Insect bite (nonvenomous) of left wrist, initial encounter: Secondary | ICD-10-CM | POA: Diagnosis not present

## 2021-12-29 MED ORDER — SULFAMETHOXAZOLE-TRIMETHOPRIM 800-160 MG PO TABS: 1.0000 | ORAL_TABLET | Freq: Two times a day (BID) | ORAL | 0 refills | Status: AC

## 2021-12-29 NOTE — ED Triage Notes (Signed)
Insect bite to left wrist area onset yesterday ?

## 2021-12-29 NOTE — Discharge Instructions (Signed)
Apply over-the-counter 1% hydrocortisone cream to the affected area twice a day.  Avoid scratching as this may cause increased swelling.  You may apply cool compresses to the area as well.  You may also take over-the-counter Benadryl 1 capsule every 6 hours as needed for itching.  Follow-up with your primary doctor for recheck or return to the emergency department for any new or worsening symptoms. ?

## 2021-12-29 NOTE — ED Provider Notes (Signed)
?[place] ?Provider Note ? ? ?CSN: [id number] ?Arrival date & time: 12/29/21  1729 ? ?  ? ?History ? ?Chief Complaint  ?Patient presents with  ? Insect Bite  ? ? ?Bonnie Warren is a 56 y.o. female. ? ?HPI ? ?  ? ? ?Bonnie Warren is a 56 y.o. female who presents to the Emergency Department complaining of itching and localized swelling to the distal left wrist.  She states that she was moving a bale of pine needles yesterday when something bit her.  She reports a sharp pain to her wrist with an immediate area of swelling.  The area has been itching today and red.  She has not tried any therapies prior to arrival.  She is concerned that she may have been bitten by a spider, although she did not see the insect.  She denies any fevers, chills, swelling or pain proximal to the bite.  No pain with movement of her fingers.  No difficulty swallowing, or complaints of swelling or itching of the face, tongue or lips ? ? ? ?Home Medications ?Prior to Admission medications   ?Medication Sig Start Date End Date Taking? Authorizing Provider  ?sulfamethoxazole-trimethoprim (BACTRIM DS) 800-160 MG tablet Take 1 tablet by mouth 2 (two) times daily for 7 days. 12/29/21 01/05/22 Yes Demarri Elie, PA-C  ?buPROPion (WELLBUTRIN XL) 150 MG 24 hr tablet Take 150 mg by mouth daily as needed. 05/10/21   [provider]  ?cyclobenzaprine (FLEXERIL) 10 MG tablet Take 1 tablet (10 mg total) by mouth 2 (two) times daily as needed for muscle spasms. 06/01/21   Marcello Fennel, PA-C  ?escitalopram (LEXAPRO) 5 MG tablet Take 5 mg by mouth at bedtime. 05/05/20   [provider]  ?fluticasone (FLONASE) 50 MCG/ACT nasal spray Place 1 spray into both nostrils daily for 14 days. 07/11/20 07/25/20  Emerson Monte, FNP  ?lisinopril-hydrochlorothiazide (ZESTORETIC) 20-12.5 MG tablet Take 1 tablet by mouth daily. 05/10/21   [provider]  ?PREMARIN vaginal cream SMARTSIG:1 Vaginal Daily PRN  01/13/21   [provider]  ?rosuvastatin (CRESTOR) 10 MG tablet Take 10 mg by mouth at bedtime. 05/09/21   [provider]  ?sertraline (ZOLOFT) 100 MG tablet Take 2 tablets (200 mg total) by mouth daily. 10/22/18 05/09/20  [name], MD  ?   ? ?Allergies    ?Patient has no known allergies.   ? ?Review of Systems   ?Review of Systems  ?Constitutional:  Negative for chills and fever.  ?Respiratory:  Negative for shortness of breath.   ?Cardiovascular:  Negative for chest pain.  ?Skin:  Positive for color change.  ?     Insect bite left wrist  ?Neurological:  Negative for dizziness, weakness and numbness.  ?All other systems reviewed and are negative. ? ?Physical Exam ?Updated Vital Signs ?BP (!) 154/95 (BP Location: Left Arm)   Pulse 72   Temp 98.6 ?F (37 ?C) (Oral)   Resp 20   Ht '5\' 2"'$  (1.575 m)   Wt 86.2 kg   SpO2 100%   BMI 34.75 kg/m?  ?Physical Exam ?Vitals and nursing note reviewed.  ?Constitutional:   ?   General: She is not in acute distress. ?   Appearance: Normal appearance. She is not ill-appearing or toxic-appearing.  ?Cardiovascular:  ?   Rate and Rhythm: Normal rate and regular rhythm.  ?   Pulses: Normal pulses.  ?Pulmonary:  ?   Effort: Pulmonary effort is normal.  ?Skin: ?  General: Skin is warm.  ?   Capillary Refill: Capillary refill takes less than 2 seconds.  ?   Comments: 1 cm area of erythema to the distal left wrist.  Area is slightly raised.  No open wounds, fluctuance or lymphangitis.  No tenderness promixally  ?Neurological:  ?   General: No focal deficit present.  ?   Mental Status: She is alert.  ?   Sensory: No sensory deficit.  ?   Motor: No weakness.  ? ? ?ED Results / Procedures / Treatments   ?Labs ?(all labs ordered are listed, but only abnormal results are displayed) ?Labs Reviewed - No data to display ? ?EKG ?None ? ?Radiology ?No results found. ? ?Procedures ?Procedures  ? ? ?Medications Ordered in ED ?Medications - No data to display ? ?ED Course/  Medical Decision Making/ A&P ?  ?                        ?Medical Decision Making ?Risk ?Prescription drug management. ? ? ?Patient here for evaluation of a insect bite that occurred yesterday.  No complaints of systemic symptoms.  No history of anaphylactic reactions.  She has not tried any therapies prior to arrival.  She does have some redness and itching at the site.  This appears to be a localized reaction.  Patient is otherwise well-appearing.  Mildly hypertensive but otherwise vital signs are reassuring.  She is agreeable to symptomatic treatment and close outpatient follow-up.  Recommended over-the-counter hydrocortisone cream and oral Benadryl if needed for itching.  Appropriate for discharge home, return precautions discussed. ? ? ? ? ? ? ? ?Final Clinical Impression(s) / ED Diagnoses ?Final diagnoses:  ?Insect bite of left wrist, initial encounter  ? ? ?Rx / DC Orders ?ED Discharge Orders   ? ?      Ordered  ?  sulfamethoxazole-trimethoprim (BACTRIM DS) 800-160 MG tablet  2 times daily       ? 12/29/21 1802  ? ?  ?  ? ?  ? ? ?  ?Kem Parkinson, PA-C ?12/29/21 1837 ? ?  ?Milton Ferguson, MD ?12/31/21 1130 ? ?

## 2022-04-06 DIAGNOSIS — Z85828 Personal history of other malignant neoplasm of skin: Secondary | ICD-10-CM | POA: Diagnosis not present

## 2022-04-06 DIAGNOSIS — D485 Neoplasm of uncertain behavior of skin: Secondary | ICD-10-CM | POA: Diagnosis not present

## 2022-04-06 DIAGNOSIS — L814 Other melanin hyperpigmentation: Secondary | ICD-10-CM | POA: Diagnosis not present

## 2022-04-06 DIAGNOSIS — L821 Other seborrheic keratosis: Secondary | ICD-10-CM | POA: Diagnosis not present

## 2022-04-06 DIAGNOSIS — L570 Actinic keratosis: Secondary | ICD-10-CM | POA: Diagnosis not present

## 2022-04-06 DIAGNOSIS — D1801 Hemangioma of skin and subcutaneous tissue: Secondary | ICD-10-CM | POA: Diagnosis not present

## 2022-04-06 DIAGNOSIS — D235 Other benign neoplasm of skin of trunk: Secondary | ICD-10-CM | POA: Diagnosis not present

## 2022-07-14 ENCOUNTER — Encounter (HOSPITAL_COMMUNITY): Payer: Self-pay | Admitting: Emergency Medicine

## 2022-07-14 ENCOUNTER — Inpatient Hospital Stay (HOSPITAL_COMMUNITY)
Admission: EM | Admit: 2022-07-14 | Discharge: 2022-07-15 | Disposition: A | Discharge status: Home / Self Care | Payer: Medicare Other | Attending: Family Medicine | Admitting: Family Medicine

## 2022-07-14 ENCOUNTER — Other Ambulatory Visit: Payer: Self-pay

## 2022-07-14 ENCOUNTER — Emergency Department (HOSPITAL_COMMUNITY): Payer: Medicare Other

## 2022-07-14 DIAGNOSIS — N83202 Unspecified ovarian cyst, left side: Secondary | ICD-10-CM | POA: Insufficient documentation

## 2022-07-14 DIAGNOSIS — N950 Postmenopausal bleeding: Secondary | ICD-10-CM | POA: Insufficient documentation

## 2022-07-14 LAB — BASIC METABOLIC PANEL
Anion gap: 9 (ref 5–15)
BUN: 15 mg/dL (ref 6–20)
CO2: 27 mmol/L (ref 22–32)
Calcium: 9.8 mg/dL (ref 8.9–10.3)
Chloride: 101 mmol/L (ref 98–111)
Creatinine, Ser: 0.78 mg/dL (ref 0.44–1.00)
GFR, Estimated: >60 mL/min (ref >60)
Glucose, Bld: 99 mg/dL (ref 70–99)
Potassium: 4.2 mmol/L (ref 3.5–5.1)
Sodium: 137 mmol/L (ref 135–145)

## 2022-07-14 LAB — CBC WITH DIFFERENTIAL/PLATELET
Abs Immature Granulocytes: 0.02 10*3/uL (ref 0.00–0.07)
Basophils Absolute: 0 10*3/uL (ref 0.0–0.1)
Basophils Relative: 1 %
Eosinophils Absolute: 0.1 10*3/uL (ref 0.0–0.5)
Eosinophils Relative: 2 %
HCT: 37.7 % (ref 36.0–46.0)
Hemoglobin: 13.4 g/dL (ref 12.0–15.0)
Immature Granulocytes: 0 %
Lymphocytes Relative: 45 %
Lymphs Abs: 3.7 10*3/uL (ref 0.7–4.0)
MCH: 32.6 pg (ref 26.0–34.0)
MCHC: 35.5 g/dL (ref 30.0–36.0)
MCV: 91.7 fL (ref 80.0–100.0)
Monocytes Absolute: 0.8 10*3/uL (ref 0.1–1.0)
Monocytes Relative: 10 %
Neutro Abs: 3.4 10*3/uL (ref 1.7–7.7)
Neutrophils Relative %: 42 %
Platelets: 229 10*3/uL (ref 150–400)
RBC: 4.11 MIL/uL (ref 3.87–5.11)
RDW: 12.9 % (ref 11.5–15.5)
WBC: 8.1 10*3/uL (ref 4.0–10.5)
nRBC: 0 % (ref 0.0–0.2)

## 2022-07-14 LAB — URINALYSIS, ROUTINE W REFLEX MICROSCOPIC
Bilirubin Urine: NEGATIVE
Glucose, UA: NEGATIVE mg/dL
Hgb urine dipstick: NEGATIVE
Ketones, ur: NEGATIVE mg/dL
Leukocytes,Ua: NEGATIVE
Nitrite: NEGATIVE
Protein, ur: NEGATIVE mg/dL
Specific Gravity, Urine: 1.003 — ABNORMAL LOW (ref 1.005–1.030)
pH: 6 (ref 5.0–8.0)

## 2022-07-14 NOTE — ED Provider Triage Note (Signed)
Emergency Medicine Provider Triage Evaluation Note  Bonnie Warren , a 56 y.o. female  was evaluated in triage.  Pt complains of pelvic pain and vaginal bleeding.  She is post-menopausal, has been having right sided pelvic pain for almost 2 years.  Had pap smear last week, told abnormal findings and set up for [place].  Pain worsened today so came here for eval.  Denies heavy bleeding, more like spotting.  Review of Systems  Positive: Vaginal bleeding, pelvic pain Negative: fever  Physical Exam  BP (!) 139/91 (BP Location: Right Arm)   Pulse 75   Temp 98.3 F (36.8 C) (Oral)   Resp 19   SpO2 100%  Gen:   Awake, no distress   Resp:  Normal effort  MSK:   Moves extremities without difficulty  Other:    Medical Decision Making  Medically screening exam initiated at 10:44 PM.  Appropriate orders placed.  Bonnie Warren was informed that the remainder of the evaluation will be completed by another provider, this initial triage assessment does not replace that evaluation, and the importance of remaining in the ED until their evaluation is complete.  Pelvic pain, vaginal bleeding.  Recent PAP with abnormal findings.  Will order labs, [place].   Larene Pickett, PA-C 07/14/22 2245

## 2022-07-14 NOTE — ED Triage Notes (Signed)
Patient reports right lower abdominal pain with vaginal bleeding /spotting for 2 years , advised by her MD to go to ER for testing .

## 2022-07-15 ENCOUNTER — Encounter (HOSPITAL_COMMUNITY): Payer: Self-pay | Admitting: Family Medicine

## 2022-07-15 DIAGNOSIS — N950 Postmenopausal bleeding: Secondary | ICD-10-CM | POA: Diagnosis not present

## 2022-07-15 NOTE — ED Notes (Signed)
Pt taken to [facility]

## 2022-07-15 NOTE — MAU Provider Note (Signed)
History     CSN: [id number]  Arrival date and time: 07/14/22 2225   None     Chief Complaint  Patient presents with   Abdominal Pain    Vaginal Bleeding   Bonnie Warren is a 56 y.o. F who presents from adult ED for 2 years of postmenopausal bleeding. It is intermittent and recently occurred 1 weeks prior. She say her OBGYN who scheduled her for an [place] and pap smear. She also states she had right lower pelvic pain for the same amount of time and happens when she turns over in bed.   Vaginal Bleeding The patient's primary symptoms include vaginal bleeding. This is a chronic problem. The current episode started more than 1 year ago. The problem occurs intermittently. The problem has been unchanged. The pain is mild. The problem affects the right side. She is not pregnant. Pertinent negatives include no abdominal pain, constipation, diarrhea, fever, headaches or urgency. The vaginal bleeding is spotting. Nothing aggravates the symptoms. She has tried nothing for the symptoms. She uses tubal ligation for contraception. She is postmenopausal.    Pertinent Gynecological History:  Sexually transmitted diseases: no past history Previous GYN Procedures:  Tubal ligation   Last mammogram: abnormal: Right breast mass; clustered cyst on follow up  Date: 08/28/2021:12/14/2021 Last pap: abnormal: ASCUS, HPV negative  Date: 05/2020  Past Medical History:  Diagnosis Date   Anxiety    Depression    Hypertension    Panic attacks     Past Surgical History:  Procedure Laterality Date   TUBAL LIGATION      Family History  Problem Relation Age of Onset   Alcohol abuse Maternal Grandfather    Depression Maternal Grandmother    Hypertension Mother     Social History   Tobacco Use   Smoking status: Never   Smokeless tobacco: Never  Vaping Use   Vaping Use: Never used  Substance Use Topics   Alcohol use: Yes    Comment: occasional, social, every 3 months   Drug use: No     Allergies: No Known Allergies  Medications Prior to Admission  Medication Sig Dispense Refill Last Dose   buPROPion (WELLBUTRIN XL) 150 MG 24 hr tablet Take 150 mg by mouth daily as needed.      cyclobenzaprine (FLEXERIL) 10 MG tablet Take 1 tablet (10 mg total) by mouth 2 (two) times daily as needed for muscle spasms. 20 tablet 0    escitalopram (LEXAPRO) 5 MG tablet Take 5 mg by mouth at bedtime.      fluticasone (FLONASE) 50 MCG/ACT nasal spray Place 1 spray into both nostrils daily for 14 days. 16 g 0    lisinopril-hydrochlorothiazide (ZESTORETIC) 20-12.5 MG tablet Take 1 tablet by mouth daily.      PREMARIN vaginal cream SMARTSIG:1 Vaginal Daily PRN      rosuvastatin (CRESTOR) 10 MG tablet Take 10 mg by mouth at bedtime.       Review of Systems  Constitutional:  Negative for fever.  Respiratory:  Negative for shortness of breath.   Cardiovascular:  Negative for chest pain.  Gastrointestinal:  Negative for abdominal pain, constipation and diarrhea.  Genitourinary:  Positive for vaginal bleeding. Negative for difficulty urinating and urgency.  Neurological:  Negative for headaches.  Psychiatric/Behavioral:  The patient is nervous/anxious.    Physical Exam   Blood pressure 125/83, pulse 80, temperature 98.7 F (37.1 C), resp. rate 18, height '5\' 2"'$  (1.575 m), weight 84.4 kg, SpO2 99 %.  Physical Exam Vitals reviewed.  Constitutional:      General: She is not in acute distress.    Appearance: She is not ill-appearing, toxic-appearing or diaphoretic.  Cardiovascular:     Rate and Rhythm: Normal rate and regular rhythm.  Pulmonary:     Effort: Pulmonary effort is normal.     Breath sounds: Normal breath sounds.  Abdominal:     General: There is no distension.     Palpations: Abdomen is soft. There is no hepatomegaly or mass.     Tenderness: There is no abdominal tenderness. There is no guarding or rebound.  Neurological:     Mental Status: She is alert and oriented to  person, place, and time.  Psychiatric:        Mood and Affect: Mood is anxious.    TRANSABDOMINAL AND TRANSVAGINAL ULTRASOUND OF PELVIS   DOPPLER ULTRASOUND OF OVARIES   TECHNIQUE: Both transabdominal and transvaginal ultrasound examinations of the pelvis were performed. Transabdominal technique was performed for global imaging of the pelvis including uterus, ovaries, adnexal regions, and pelvic cul-de-sac.   It was necessary to proceed with endovaginal exam following the transabdominal exam to visualize the endometrium. Color and duplex Doppler ultrasound was utilized to evaluate blood flow to the ovaries.   COMPARISON:  None available.   FINDINGS: Uterus   Measurements: 7.0 x 3.5 x 4.5 cm = volume: 57.4 mL. Uterus is anteverted. Heterogeneous echogenic lesion positioned at the level of the lower uterine segment/cervix measuring 1.2 x 1.0 x 0.9 cm (image 53). Few nabothian cysts noted about the cervix as well, largest of which is minimally complex and measures up to 1.4 cm.   Endometrium   Thickness: 4 mm.  No focal abnormality visualized.   Right ovary   Measurements: 1.8 x 1.5 x 2.0 cm = volume: 2.8 mL. Normal appearance/no adnexal mass.   Left ovary   Measurements: 2.5 x 1.7 x 2.0 cm = volume: 4.5 mL. 1.4 cm simple cyst. No other adnexal mass.   Pulsed Doppler evaluation of both ovaries demonstrates normal low-resistance arterial and venous waveforms.   Other findings   No abnormal free fluid.   IMPRESSION: 1. 1.4 cm simple left ovarian cyst. This is almost certainly benign given size and appearance, with no follow-up imaging recommended regarding this lesion note: This recommendation does not apply to premenarchal patients or to those with increased risk (genetic, family history, elevated tumor markers or other high-risk factors) of ovarian cancer. Reference: Radiology 2019 Nov; 293(2):359-371. 2. No evidence for ovarian torsion or other acute finding.  No free fluid. 3. 1.4 cm complex echogenic lesion positioned at the lower uterine segment/cervix. While this finding is favored to reflect a small calcified fibroid or possibly complex nabothian cyst, a possible endometrial based lesion is difficult to exclude. Consider sonohysterogram for further evaluation, prior to hysteroscopy or endometrial biopsy. Endometrial stripe otherwise within normal limits measuring 4 mm in thickness.     Electronically Signed   By: Jeannine Boga M.D.   On: 07/15/2022 01:42 MAU Course  Procedures  MDM VSS. CBC, CMP, UA wnl.  Reviewed Korea above and OBGYN note from 07/11/2022.   Assessment and Plan   Postmenopausal bleeding Bonnie Warren is a 56 y.o. who presented to the adult ED and transferred care to the MAU for PMB and right pelvic cramping. This is an intermittent chronic problem for 2 years; most recent episode of spotting was 1 week ago. Today her US showed nabothian cysts and a  1cm heterogeneous echogenic lesion in the LUS/cervix. Right pelvic cramping with movement likely MSK etiology in the setting of negative labs, no acute abdomen, and absent findings on [place].  Discussed US findings and OBGYN follow up (list given as requested by patient for new gyn) for possible outpatient hysterogram/endometrial biopsy.    Bonnie Warren 07/15/2022, 1:31 PM

## 2022-07-15 NOTE — MAU Note (Signed)
.  transfer from [facility]. Had PAP this week at [facility] office [facility]). Has had abd pain ever since and vaginal spotting. BP 125/83   Pulse 80   Temp 98.7 F (37.1 C)   Resp 18   Ht '5\' 2"'$  (1.575 m)   Wt 84.4 kg   SpO2 99%   BMI 34.02 kg/m

## 2022-07-15 NOTE — Discharge Instructions (Signed)
Follow up with OBGYN.

## 2022-07-23 ENCOUNTER — Ambulatory Visit (INDEPENDENT_AMBULATORY_CARE_PROVIDER_SITE_OTHER): Payer: Medicare Other | Admitting: Obstetrics & Gynecology

## 2022-07-23 ENCOUNTER — Telehealth: Payer: Self-pay | Admitting: Obstetrics & Gynecology

## 2022-07-23 ENCOUNTER — Encounter: Payer: Self-pay | Admitting: Obstetrics & Gynecology

## 2022-07-23 ENCOUNTER — Other Ambulatory Visit (HOSPITAL_COMMUNITY)
Admission: RE | Admit: 2022-07-23 | Discharge: 2022-07-23 | Disposition: A | Discharge status: Home / Self Care | Payer: Medicare Other | Source: Ambulatory Visit | Attending: Obstetrics & Gynecology | Admitting: Obstetrics & Gynecology

## 2022-07-23 VITALS — BP 124/71 | HR 85 | Ht 62.0 in | Wt 186.0 lb

## 2022-07-23 DIAGNOSIS — N950 Postmenopausal bleeding: Secondary | ICD-10-CM | POA: Insufficient documentation

## 2022-07-23 DIAGNOSIS — R102 Pelvic and perineal pain: Secondary | ICD-10-CM | POA: Diagnosis not present

## 2022-07-23 DIAGNOSIS — N952 Postmenopausal atrophic vaginitis: Secondary | ICD-10-CM | POA: Diagnosis not present

## 2022-07-23 NOTE — Telephone Encounter (Signed)
Pt has questions about her bleeding. Pt is requesting a call back.

## 2022-07-23 NOTE — Telephone Encounter (Signed)
Pt called back to ask more questions about endometrial biopsy and bleeding. She is not currently bleeding but wondered why Dr. Nelda Marseille can't see any blood. I told her that if she isn't bleeding currently we wouldn't see any blood. Pt had no other questions at this time.

## 2022-07-23 NOTE — Progress Notes (Signed)
GYN VISIT Patient name: Bonnie Warren MRN [id number]  Date of birth: [birth year] Chief Complaint:   PMB  History of Present Illness:   Bonnie Warren is a 56 y.o. [phone] female being seen today for follow up regarding:  -PMB:  Notes intermittent bleeding for the past 1 yr.  Notes pink spotting, happens sporadically when she wipes.  Typically does not happen that often, maybe 1-2x per month.  Denies discharge, itching.  Notes blood smell.  -RLQ pain: Reports stabbing pain when she rolls on her left side.  Sometimes she will not the pain with movement. Almost every night notes the pain.  Does not take pain medication.  Rates the pain 5/10.  Reports no other acute complaints  [place] completed in MAU: Uterus  Measurements: 7.0 x 3.5 x 4.5 cm = volume: 57.4 mL. Uterus is anteverted. Heterogeneous echogenic lesion positioned at the level of the lower uterine segment/cervix measuring 1.2 x 1.0 x 0.9 cm (image 53). Few nabothian cysts noted about the cervix as well, largest of which is minimally complex and measures up to 1.4 cm.   Endometrium   Thickness: 4 mm.  No focal abnormality visualized.   Right ovary   Measurements: 1.8 x 1.5 x 2.0 cm = volume: 2.8 mL. Normal appearance/no adnexal mass.   Left ovary   Measurements: 2.5 x 1.7 x 2.0 cm = volume: 4.5 mL. 1.4 cm simple cyst. No other adnexal mass.  No LMP recorded. Patient is postmenopausal.     05/23/2018    9:57 AM 04/09/2018   11:17 AM 01/09/2018   11:16 AM  Depression screen PHQ 2/9  Decreased Interest     Down, Depressed, Hopeless     PHQ - 2 Score     Altered sleeping     Tired, decreased energy     Change in appetite     Feeling bad or failure about yourself      Trouble concentrating     Moving slowly or fidgety/restless     Suicidal thoughts     PHQ-9 Score        Information is confidential and restricted. Go to Review Flowsheets to unlock data.     Review of Systems:   Pertinent items are  noted in HPI Denies fever/chills, dizziness, headaches, visual disturbances, fatigue, shortness of breath, chest pain, abdominal pain, vomiting. Pertinent History Reviewed:  Reviewed past medical,surgical, social, obstetrical and family history.  Reviewed problem list, medications and allergies. Physical Assessment:   Vitals:   07/23/22 1413  BP: 124/71  Pulse: 85  Weight: 186 lb (84.4 kg)  Height: '5\' 2"'$  (1.575 m)  Body mass index is 34.02 kg/m.       Physical Examination:   General appearance: alert, well appearing, and in no distress  Psych: mood appropriate, normal affect  Skin: warm & dry   Cardiovascular: normal heart rate noted  Respiratory: normal respiratory effort, no distress  Abdomen: soft, non-tender, no rebound, no guarding  Pelvic: normal external genitalia, vulva, vagina, cervix, uterus and adnexa  Extremities: no edema   Chaperone: Celene Squibb    Endometrial Biopsy Procedure Note  Pre-operative Diagnosis: Postmenopausal bleeding  Post-operative Diagnosis: same  Procedure Details  The risks (including infection, bleeding, pain, and uterine perforation) and benefits of the procedure were explained to the patient and Written informed consent was obtained.  Antibiotic prophylaxis against endocarditis was not indicated.   The patient was placed in the dorsal lithotomy position.  Bimanual exam showed  the uterus to be in the neutral position.  A speculum inserted in the vagina, and the cervix prepped with betadine.     A single tooth tenaculum was applied to the anterior lip of the cervix for stabilization.  A A Pipelle endometrial aspirator was used to sample the endometrium.  Sample was sent for pathologic examination.  Condition: Stable  Complications: None   Assessment & Plan:  1) Postmenopausal bleeding -discussed potential causes ranging from atrophy to endometrial carcinoma -discussed US findings and in light of continued spotting, advised EMB  today -see above procedure -further management pending results -if negative EMB and spotting continues, may consider vaginal estrogen therapy as I suspect the spotting may be due to atrophic changes The patient was advised to call for any fever or for prolonged or severe pain or bleeding. She was advised to use OTC analgesics as needed for mild to moderate pain. She was advised to avoid vaginal intercourse for 48 hours or until the bleeding has completely stopped.  2) pelvic pain -no abnormalities noted on exam  -[place] reviewed, no gyn abnormalities appreciated  Return in about 1 year (around 07/24/2023) for Annual.   Janyth Pupa, DO Attending [place], [place] for [place], [place]

## 2022-07-23 NOTE — Addendum Note (Signed)
Addended by: Janece Canterbury on: 07/23/2022 03:07 PM   Modules accepted: Orders

## 2022-07-25 ENCOUNTER — Telehealth: Payer: Self-pay | Admitting: Obstetrics & Gynecology

## 2022-07-25 LAB — SURGICAL PATHOLOGY

## 2022-07-25 NOTE — Telephone Encounter (Signed)
Called and let vm that we do not have any results back yet.

## 2022-07-25 NOTE — Telephone Encounter (Signed)
Pt is requesting a call to talk about her results.

## 2022-07-27 ENCOUNTER — Telehealth: Payer: Self-pay | Admitting: Obstetrics & Gynecology

## 2022-07-27 NOTE — Telephone Encounter (Signed)
Pt is asking for a call about her results.

## 2022-07-30 ENCOUNTER — Telehealth: Payer: Self-pay | Admitting: Family Medicine

## 2022-07-30 NOTE — Telephone Encounter (Signed)
Patient called about an appt she has scheduled for 11/7. She is not sure why she has the the appt or who referred her. She is legally blind and not sure that she can get a ride in time for the appt. She asks that Dr. Jenne Campus call her please, [phone]

## 2022-07-31 ENCOUNTER — Ambulatory Visit: Payer: Medicare Other | Admitting: Family Medicine

## 2022-08-02 NOTE — Telephone Encounter (Signed)
Sorry, I forgot to message you.  I spoke with the patient, and took care of this.  Thanks!

## 2022-08-07 ENCOUNTER — Ambulatory Visit (INDEPENDENT_AMBULATORY_CARE_PROVIDER_SITE_OTHER): Payer: Medicare Other | Admitting: Obstetrics & Gynecology

## 2022-08-07 ENCOUNTER — Encounter: Payer: Self-pay | Admitting: Obstetrics & Gynecology

## 2022-08-07 VITALS — BP 128/82 | HR 102 | Ht 62.0 in | Wt 184.6 lb

## 2022-08-07 DIAGNOSIS — N760 Acute vaginitis: Secondary | ICD-10-CM | POA: Diagnosis not present

## 2022-08-07 DIAGNOSIS — N952 Postmenopausal atrophic vaginitis: Secondary | ICD-10-CM

## 2022-08-07 DIAGNOSIS — B9689 Other specified bacterial agents as the cause of diseases classified elsewhere: Secondary | ICD-10-CM

## 2022-08-07 DIAGNOSIS — N8111 Cystocele, midline: Secondary | ICD-10-CM | POA: Diagnosis not present

## 2022-08-07 MED ORDER — METRONIDAZOLE 500 MG PO TABS: 500.0000 mg | ORAL_TABLET | Freq: Two times a day (BID) | ORAL | 0 refills | Status: AC

## 2022-08-07 NOTE — Progress Notes (Signed)
   GYN VISIT Patient name: Bonnie Warren MRN [id number]  Date of birth: 09-Jun-1966 Chief Complaint:   surgery consult  History of Present Illness:   Bonnie Warren is a 56 y.o. [phone] PM female being seen today for the following concerns:  Today she notes that she is not having any vaginal bleeding.  Pt was wondering if a prevent hysterectomy would be a good idea.  Also discussed that she notes an occasional vaginal odor- records reviewed, Pap showed shift of BV. Pt also shared that she has been treated for a "weak bladder" and was started on vaginal estrogen cream and a pessary, which she is not using but plans to restart.  No LMP recorded. Patient is postmenopausal.     05/23/2018    9:57 AM 04/09/2018   11:17 AM 01/09/2018   11:16 AM  Depression screen PHQ 2/9  Decreased Interest     Down, Depressed, Hopeless     PHQ - 2 Score     Altered sleeping     Tired, decreased energy     Change in appetite     Feeling bad or failure about yourself      Trouble concentrating     Moving slowly or fidgety/restless     Suicidal thoughts     PHQ-9 Score        Information is confidential and restricted. Go to Review Flowsheets to unlock data.     Review of Systems:   Pertinent items are noted in HPI Denies fever/chills, dizziness, headaches, visual disturbances, fatigue, shortness of breath, chest pain, abdominal pain, vomiting, no problems with bowel movements, urination, or intercourse unless otherwise stated above.  Pertinent History Reviewed:  Reviewed past medical,surgical, social, obstetrical and family history.  Reviewed problem list, medications and allergies. Physical Assessment:   Vitals:   08/07/22 1344  BP: 128/82  Pulse: (!) 102  Weight: 184 lb 9.6 oz (83.7 kg)  Height: '5\' 2"'$  (1.575 m)  Body mass index is 33.76 kg/m.       Physical Examination:   General appearance: alert, well appearing, and in no distress  Psych: mood appropriate, normal  affect  Skin: warm & dry   Cardiovascular: normal heart rate noted  Respiratory: normal respiratory effort, no distress  Pelvic: deferred  Extremities: no edema   Chaperone: N/A    Assessment & Plan:  1) Surgical consultation -reviewed risk/benefit of surgery -discussed that currently there is not a medical indication for surgery -also discussed increased risk of all-cause mortality for oophorectomy in women <65yo -addressed concerns/questions  2) Bacterial vaginosis -per pap, not previously treated -Rx sent in   3) POP/Vaginal atrophy -encouraged regular use of estrogen twice per week -may re-insert pessary and f/u in 3-4 mos -if she is unable to place, RTC for initial placement  Meds ordered this encounter  Medications   metroNIDAZOLE (FLAGYL) 500 MG tablet    Sig: Take 1 tablet (500 mg total) by mouth 2 (two) times daily for 7 days.    Dispense:  14 tablet    Refill:  0     Return in about 3 months (around 11/07/2022) for pessary check.   Janyth Pupa, DO Attending [place], [facility] for [employer], [place]

## 2022-09-24 DIAGNOSIS — M1990 Unspecified osteoarthritis, unspecified site: Secondary | ICD-10-CM

## 2022-09-24 HISTORY — DX: Unspecified osteoarthritis, unspecified site: M19.90

## 2022-09-28 ENCOUNTER — Other Ambulatory Visit: Payer: Self-pay | Admitting: Internal Medicine

## 2022-09-28 DIAGNOSIS — Z1231 Encounter for screening mammogram for malignant neoplasm of breast: Secondary | ICD-10-CM

## 2022-10-02 ENCOUNTER — Ambulatory Visit
Admission: RE | Admit: 2022-10-02 | Discharge: 2022-10-02 | Disposition: A | Discharge status: Home / Self Care | Payer: Medicare Other | Source: Ambulatory Visit | Attending: Internal Medicine | Admitting: Internal Medicine

## 2022-10-02 ENCOUNTER — Telehealth: Payer: Self-pay | Admitting: Obstetrics & Gynecology

## 2022-10-02 DIAGNOSIS — Z1231 Encounter for screening mammogram for malignant neoplasm of breast: Secondary | ICD-10-CM

## 2022-10-02 NOTE — Telephone Encounter (Signed)
LMOVM returning patient's call.  

## 2022-10-02 NOTE — Telephone Encounter (Signed)
Pt states she is having vaginal cramping and would like a call

## 2023-01-21 ENCOUNTER — Ambulatory Visit (INDEPENDENT_AMBULATORY_CARE_PROVIDER_SITE_OTHER): Payer: Medicare Other | Admitting: Obstetrics & Gynecology

## 2023-01-21 ENCOUNTER — Encounter: Payer: Self-pay | Admitting: Obstetrics & Gynecology

## 2023-01-21 VITALS — BP 119/77 | HR 73 | Ht 62.0 in | Wt 199.6 lb

## 2023-01-21 DIAGNOSIS — N8111 Cystocele, midline: Secondary | ICD-10-CM

## 2023-01-21 DIAGNOSIS — N950 Postmenopausal bleeding: Secondary | ICD-10-CM

## 2023-01-21 DIAGNOSIS — N952 Postmenopausal atrophic vaginitis: Secondary | ICD-10-CM | POA: Diagnosis not present

## 2023-01-21 DIAGNOSIS — Z4689 Encounter for fitting and adjustment of other specified devices: Secondary | ICD-10-CM

## 2023-01-21 NOTE — Progress Notes (Signed)
   GYN VISIT Patient name: Bonnie Warren MRN [id number]  Date of birth: 1965-11-22 Chief Complaint:   Pessary Check  History of Present Illness:   Bonnie Warren is a 58 y.o. [phone] PM female being seen today for follow-up regarding:  -Pessary maintenance: It unclear if the patient never put the pessary in or it recently fell out.  She does desire to have the pessary put back in.  She does feel like it was helping her.  -Vaginal atrophy: Patient was to have started estrogen however she today cannot recall why she did not yet start it  -She has not been using the vaginal estrogen cream-   No LMP recorded. Patient is postmenopausal.     05/23/2018    9:57 AM 04/09/2018   11:17 AM 01/09/2018   11:16 AM  Depression screen PHQ 2/9  Decreased Interest     Down, Depressed, Hopeless     PHQ - 2 Score     Altered sleeping     Tired, decreased energy     Change in appetite     Feeling bad or failure about yourself      Trouble concentrating     Moving slowly or fidgety/restless     Suicidal thoughts     PHQ-9 Score        Information is confidential and restricted. Go to Review Flowsheets to unlock data.     Review of Systems:   Pertinent items are noted in HPI Denies fever/chills, dizziness, headaches, visual disturbances, fatigue, shortness of breath, chest pain, abdominal pain, vomiting, no problems with bowel movements, urination, or intercourse unless otherwise stated above.  Pertinent History Reviewed:  Reviewed past medical,surgical, social, obstetrical and family history.  Reviewed problem list, medications and allergies. Physical Assessment:   Vitals:   01/21/23 1341  BP: 119/77  Pulse: 73  Weight: 199 lb 9.6 oz (90.5 kg)  Height: 5\' 2"  (1.575 m)  Body mass index is 36.51 kg/m.       Physical Examination:   General appearance: alert, well appearing, and in no distress  Psych: mood appropriate, normal affect  Skin: warm & dry   Cardiovascular:  normal heart rate noted  Respiratory: normal respiratory effort, no distress  Abdomen: soft, non-tender   Pelvic: VULVA: normal appearing vulva with no masses, tenderness or lesions, VAGINA: normal appearing vagina with normal color and discharge, no lesions, Milex ring placed without complications  Extremities: no edema   Chaperone:  pt declined     Assessment & Plan:  1) POP, pessary maintenance -Discussed visits every 3 to 4 months versus self removal and replacement -Will plan for maintenance in office  2) Vaginal atrophy Reviewed use of estrogen, patient plans to start use weekly   Return in about 3 months (around 04/22/2023) for pessary maintenance with Kidus Delman or Eure.   Myna Hidalgo, DO Attending Obstetrician & Gynecologist, [facility] for [employer], [facility]

## 2023-04-01 ENCOUNTER — Ambulatory Visit
Admission: EM | Admit: 2023-04-01 | Discharge: 2023-04-01 | Disposition: A | Discharge status: Home / Self Care | Payer: Medicare Other | Attending: Family Medicine | Admitting: Family Medicine

## 2023-04-01 ENCOUNTER — Ambulatory Visit (INDEPENDENT_AMBULATORY_CARE_PROVIDER_SITE_OTHER): Payer: Medicare Other

## 2023-04-01 DIAGNOSIS — J209 Acute bronchitis, unspecified: Secondary | ICD-10-CM | POA: Diagnosis not present

## 2023-04-01 MED ORDER — PROMETHAZINE-DM 6.25-15 MG/5ML PO SYRP: 5.0000 mL | ORAL_SOLUTION | Freq: Four times a day (QID) | ORAL | 0 refills | Status: DC | PRN

## 2023-04-01 MED ORDER — ALBUTEROL SULFATE (2.5 MG/3ML) 0.083% IN NEBU
2.5000 mg | INHALATION_SOLUTION | Freq: Once | RESPIRATORY_TRACT | Status: AC
  Administered 2023-04-01: 2.5 mg via RESPIRATORY_TRACT

## 2023-04-01 MED ORDER — ALBUTEROL SULFATE HFA 108 (90 BASE) MCG/ACT IN AERS: 2.0000 | INHALATION_SPRAY | RESPIRATORY_TRACT | 0 refills | Status: DC | PRN

## 2023-04-01 NOTE — ED Triage Notes (Signed)
Pt reports she has had nasal congestion, chest discomfort, mucus drainage, sharp ear pain on and off x 3 days.  Used mucinex   Wants chest x ray states she is wheezing

## 2023-04-01 NOTE — ED Provider Notes (Signed)
[facility]    CSN: [id number] Arrival date & time: 04/01/23  1336      History   Chief Complaint No chief complaint on file.   HPI Bonnie Warren is a 57 y.o. female.   Patient presenting today with 3-day history of congestion, sharp right ear pain off-and-on, wheezing, chest tightness, fatigue and malaise.  Denies chest pain, significant shortness of breath, abdominal pain, nausea vomiting diarrhea, fever.  So far try Mucinex with minimal relief.  Requesting a chest x-ray due to wheezing and history of pneumonia.    Past Medical History:  Diagnosis Date   Anxiety    Depression    Hypertension    Legally blind    Panic attacks     Patient Active Problem List   Diagnosis Date Noted   GAD (generalized anxiety disorder) 10/24/2017   Moderate episode of recurrent major depressive disorder (HCC) 10/24/2017   Panic disorder 10/24/2017    Past Surgical History:  Procedure Laterality Date   TUBAL LIGATION      OB History     Gravida  2   Para  2   Term  2   Preterm      AB      Living  2      SAB      IAB      Ectopic      Multiple      Live Births  2            Home Medications    Prior to Admission medications   Medication Sig Start Date End Date Taking? Authorizing Provider  albuterol (VENTOLIN HFA) 108 (90 Base) MCG/ACT inhaler Inhale 2 puffs into the lungs every 4 (four) hours as needed for wheezing or shortness of breath. 04/01/23  Yes Particia Nearing, PA-C  promethazine-dextromethorphan (PROMETHAZINE-DM) 6.25-15 MG/5ML syrup Take 5 mLs by mouth 4 (four) times daily as needed. 04/01/23  Yes Particia Nearing, PA-C  buPROPion (WELLBUTRIN XL) 150 MG 24 hr tablet Take 150 mg by mouth daily as needed. 05/10/21   [provider]  diazepam (VALIUM) 5 MG tablet Take by mouth. 03/06/22   [provider]  fluticasone (FLONASE) 50 MCG/ACT nasal spray Place 1 spray into both nostrils daily for 14 days.  07/11/20 01/21/23  Avegno, Zachery Dakins, FNP  hydrochlorothiazide (HYDRODIURIL) 12.5 MG tablet Take 12.5 mg by mouth daily. 07/03/22   [provider]  lisinopril (ZESTRIL) 20 MG tablet Take 20 mg by mouth daily. 07/03/22   [provider]  rosuvastatin (CRESTOR) 10 MG tablet Take 10 mg by mouth at bedtime. 05/09/21   [provider]  sertraline (ZOLOFT) 100 MG tablet Take 2 tablets (200 mg total) by mouth daily. 10/22/18 05/09/20  Neysa Hotter, MD    Family History Family History  Problem Relation Age of Onset   Hypertension Mother    Depression Maternal Grandmother    Alcohol abuse Maternal Grandfather    Breast cancer Neg Hx     Social History Social History   Tobacco Use   Smoking status: Never   Smokeless tobacco: Never  Vaping Use   Vaping Use: Never used  Substance Use Topics   Alcohol use: Yes    Comment: occasional, social, every 3 months   Drug use: No     Allergies   Patient has no known allergies.   Review of Systems Review of Systems Per HPI  Physical Exam Triage Vital Signs ED Triage Vitals  Enc Vitals Group     BP 04/01/23 1347 129/85     Pulse Rate 04/01/23 1347 95     Resp 04/01/23 1347 20     Temp 04/01/23 1347 98 F (36.7 C)     Temp Source 04/01/23 1347 Oral     SpO2 04/01/23 1347 97 %     Weight --      Height --      Head Circumference --      Peak Flow --      Pain Score 04/01/23 1346 5     Pain Loc --      Pain Edu? --      Excl. in GC? --    No data found.  Updated Vital Signs BP 129/85 (BP Location: Right Arm)   Pulse 95   Temp 98 F (36.7 C) (Oral)   Resp 20   SpO2 (S) 99% Comment: after breathing treatment  Visual Acuity Right Eye Distance:   Left Eye Distance:   Bilateral Distance:    Right Eye Near:   Left Eye Near:    Bilateral Near:     Physical Exam Vitals and nursing note reviewed.  Constitutional:      Appearance: Normal appearance.  HENT:     Head: Atraumatic.     Right Ear:  Tympanic membrane and external ear normal.     Left Ear: Tympanic membrane and external ear normal.     Nose: Rhinorrhea present.     Mouth/Throat:     Mouth: Mucous membranes are moist.     Pharynx: Posterior oropharyngeal erythema present.  Eyes:     Extraocular Movements: Extraocular movements intact.     Conjunctiva/sclera: Conjunctivae normal.  Cardiovascular:     Rate and Rhythm: Normal rate and regular rhythm.     Heart sounds: Normal heart sounds.  Pulmonary:     Effort: Pulmonary effort is normal.     Breath sounds: Wheezing present.  Musculoskeletal:        General: Normal range of motion.     Cervical back: Normal range of motion and neck supple.  Skin:    General: Skin is warm and dry.  Neurological:     Mental Status: She is alert and oriented to person, place, and time.  Psychiatric:        Mood and Affect: Mood normal.        Thought Content: Thought content normal.      UC Treatments / Results  Labs (all labs ordered are listed, but only abnormal results are displayed) Labs Reviewed - No data to display  EKG   Radiology DG Chest 2 View  Result Date: 04/01/2023 CLINICAL DATA:  Cough, wheezing EXAM: CHEST - 2 VIEW COMPARISON:  05/09/2020 FINDINGS: Cardiac size is within normal limits. There are no signs of pulmonary edema or focal pulmonary consolidation. Evaluation of lung fields is less than optimal due to the technique. There is no pleural effusion or pneumothorax. IMPRESSION: There are no new infiltrates or signs of pulmonary edema. Electronically Signed   By: Ernie Avena M.D.   On: 04/01/2023 14:26    Procedures Procedures (including critical care time)  Medications Ordered in UC Medications  albuterol (PROVENTIL) (2.5 MG/3ML) 0.083% nebulizer solution 2.5 mg (2.5 mg Nebulization Given 04/01/23 1423)    Initial Impression / Assessment and Plan / UC Course  I have reviewed the triage vital signs and the nursing notes.  Pertinent labs &  imaging results that were available  during my care of the patient were reviewed by me and considered in my medical decision making (see chart for details).     Significant improvement on repeat lung examination after albuterol nebulizer treatment, and chest x-ray negative for acute cardiopulmonary abnormality.  Will treat with albuterol, Phenergan DM, supportive over-the-counter medications and home care. Return for worsening symptoms. Final Clinical Impressions(s) / UC Diagnoses   Final diagnoses:  Acute bronchitis, unspecified organism   Discharge Instructions   None    ED Prescriptions     Medication Sig Dispense Auth. Provider   albuterol (VENTOLIN HFA) 108 (90 Base) MCG/ACT inhaler Inhale 2 puffs into the lungs every 4 (four) hours as needed for wheezing or shortness of breath. 18 g Roosvelt Maser [place], [place]   promethazine-dextromethorphan (PROMETHAZINE-DM) 6.25-15 MG/5ML syrup Take 5 mLs by mouth 4 (four) times daily as needed. 100 mL Particia Nearing, [place]      PDMP not reviewed this encounter.   Particia Nearing, [place] 04/01/23 1539

## 2023-06-28 ENCOUNTER — Other Ambulatory Visit (HOSPITAL_COMMUNITY): Payer: Self-pay | Admitting: Internal Medicine

## 2023-06-28 DIAGNOSIS — M545 Low back pain, unspecified: Secondary | ICD-10-CM

## 2023-07-03 ENCOUNTER — Ambulatory Visit (HOSPITAL_COMMUNITY)
Admission: RE | Admit: 2023-07-03 | Discharge: 2023-07-03 | Disposition: A | Discharge status: Home / Self Care | Payer: Medicare Other | Source: Ambulatory Visit | Attending: Internal Medicine | Admitting: Internal Medicine

## 2023-07-03 ENCOUNTER — Encounter (HOSPITAL_COMMUNITY): Payer: Self-pay | Admitting: Radiology

## 2023-07-03 DIAGNOSIS — M545 Low back pain, unspecified: Secondary | ICD-10-CM | POA: Diagnosis present

## 2023-07-04 ENCOUNTER — Other Ambulatory Visit (INDEPENDENT_AMBULATORY_CARE_PROVIDER_SITE_OTHER): Payer: Medicare Other

## 2023-07-04 DIAGNOSIS — R319 Hematuria, unspecified: Secondary | ICD-10-CM

## 2023-07-04 DIAGNOSIS — R8290 Unspecified abnormal findings in urine: Secondary | ICD-10-CM

## 2023-07-04 LAB — POCT URINALYSIS DIPSTICK OB
Glucose, UA: NEGATIVE
Ketones, UA: NEGATIVE
Nitrite, UA: NEGATIVE
POC,PROTEIN,UA: NEGATIVE

## 2023-07-04 NOTE — Progress Notes (Signed)
   NURSE VISIT- UTI SYMPTOMS   SUBJECTIVE:  Bonnie Warren is a 57 y.o. [phone] female here for UTI symptoms. She is a GYN patient. She reports cloudy urine and hematuria.  OBJECTIVE:  There were no vitals taken for this visit.  Appears well, in no apparent distress  Results for orders placed or performed in visit on 07/04/23 (from the past 24 hour(s))  POC Urinalysis Dipstick OB   Collection Time: 07/04/23  1:48 PM  Result Value Ref Range   Color, UA     Clarity, UA     Glucose, UA Negative Negative   Bilirubin, UA     Ketones, UA neg    Spec Grav, UA     Blood, UA trace    pH, UA     POC,PROTEIN,UA Negative Negative, Trace, Small (1+), Moderate (2+), Large (3+), 4+   Urobilinogen, UA     Nitrite, UA neg    Leukocytes, UA Small (1+) (A) Negative   Appearance     Odor      ASSESSMENT: GYN patient with UTI symptoms and negative nitrites  PLAN: Note routed to Cyril Mourning, AGNP   Rx sent by provider today: No Urine culture sent Call or return to clinic prn if these symptoms worsen or fail to improve as anticipated. Follow-up: as needed   Jobe Marker  07/04/2023 1:50 PM

## 2023-07-05 LAB — URINALYSIS, ROUTINE W REFLEX MICROSCOPIC
Bilirubin, UA: NEGATIVE
Glucose, UA: NEGATIVE
Ketones, UA: NEGATIVE
Nitrite, UA: NEGATIVE
Protein,UA: NEGATIVE
RBC, UA: NEGATIVE
Specific Gravity, UA: 1.007 (ref 1.005–1.030)
Urobilinogen, Ur: 0.2 mg/dL (ref 0.2–1.0)
pH, UA: 6 (ref 5.0–7.5)

## 2023-07-05 LAB — MICROSCOPIC EXAMINATION: Casts: NONE SEEN /[LPF]

## 2023-07-06 LAB — URINE CULTURE: Organism ID, Bacteria: NO GROWTH

## 2023-07-18 IMAGING — CT CT L SPINE W/O CM
3 series · 14 of 33 positions shown, 17 images · non-contrast
Comparison: None

CLINICAL DATA: Back trauma, no prior imaging. Additional history
provided: Low back pain, fall 2 months ago in [employer].

EXAM:
CT LUMBAR SPINE WITHOUT CONTRAST
TECHNIQUE: Multidetector CT imaging of the lumbar spine was performed without
intravenous contrast administration. Multiplanar CT image
reconstructions were also generated.

[Series 4: l spine soft · axial · 0.38mm/px · z∈[+1070,+1230]mm · 6 of 104 slices shown, 8 images]
[im 16/104  soft-tissue]
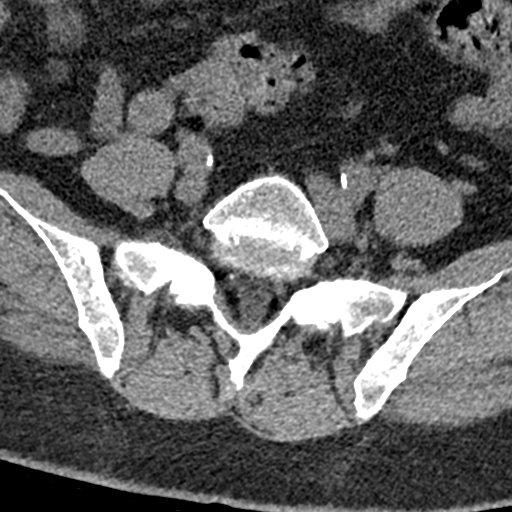
[im 16/104  bone]
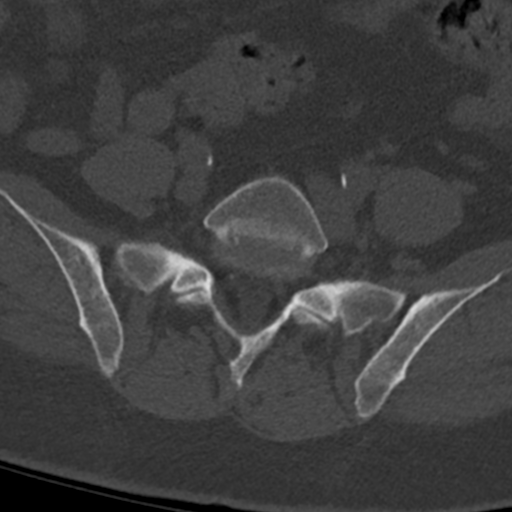
[im 32/104  bone]
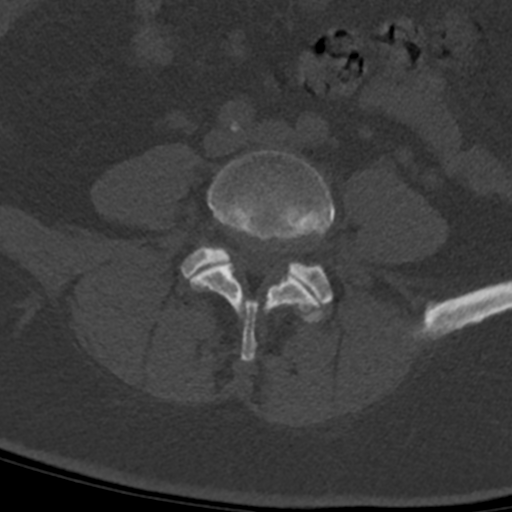
[im 48/104  bone]
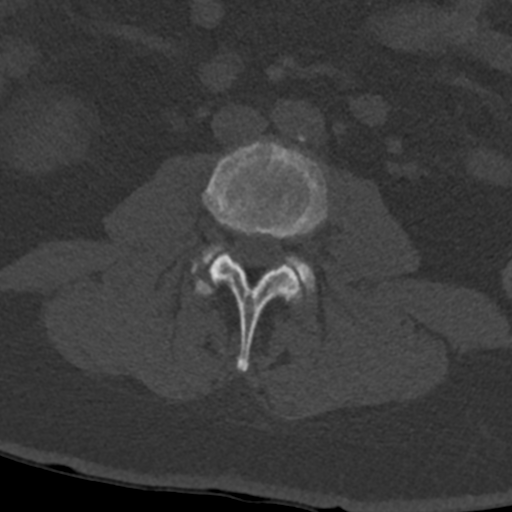
[im 64/104  bone]
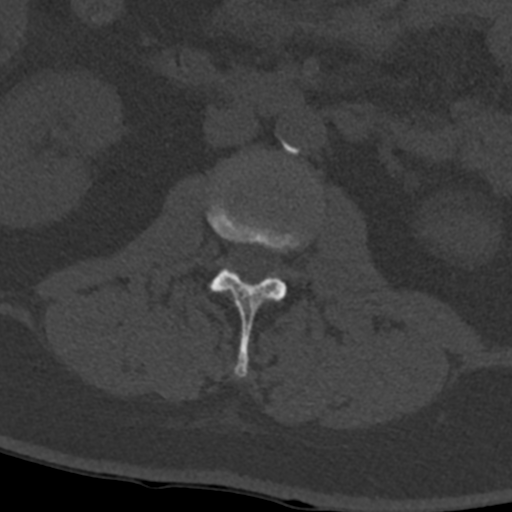
[im 80/104  soft-tissue]
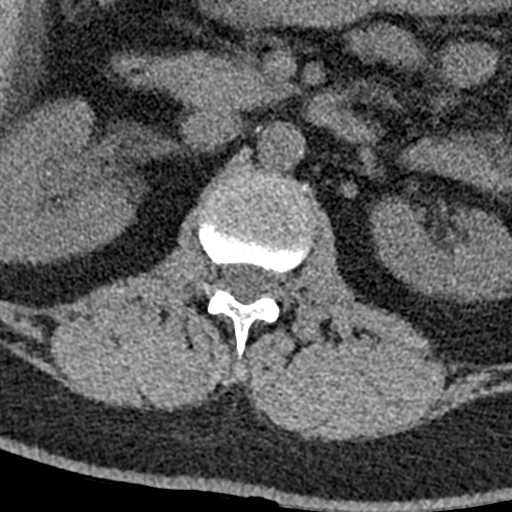
[im 80/104  bone]
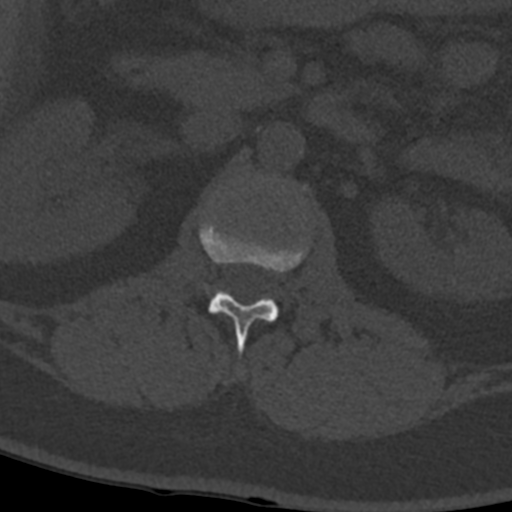
[im 96/104  bone]
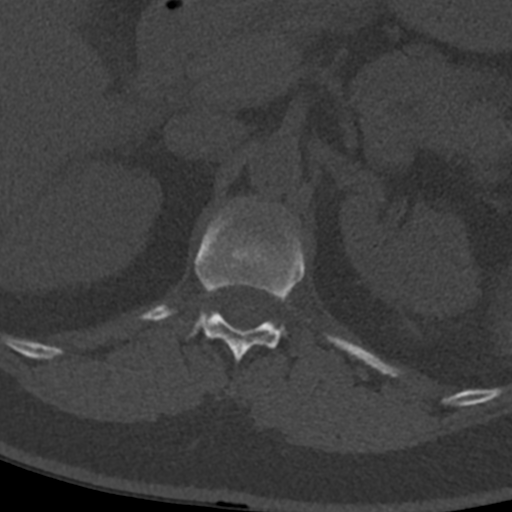

[Series 5: sagittal bone · sagittal · 0.38mm/px · 5 of 73 slices shown, 6 images]
[im 25/73  bone]
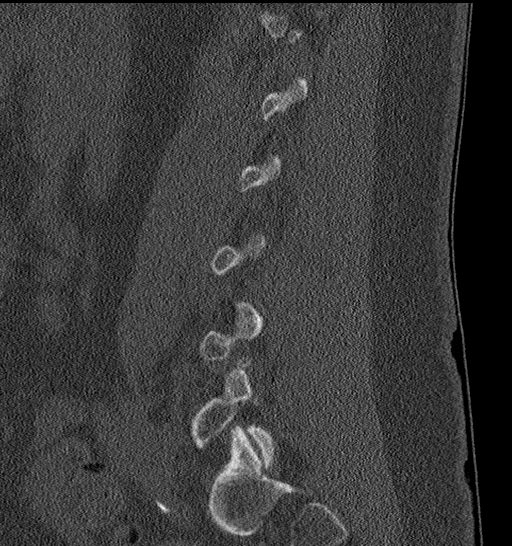
[im 31/73  bone]
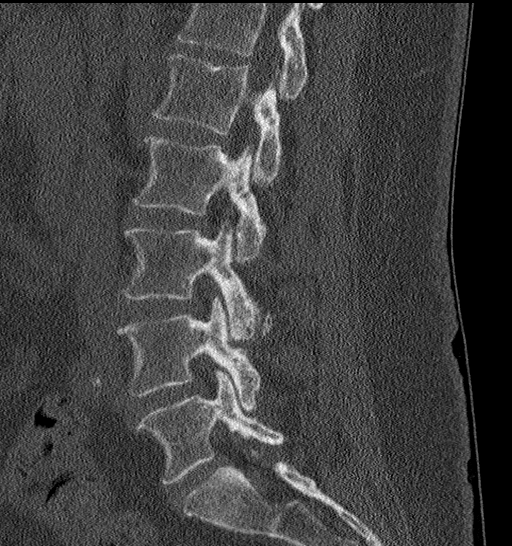
[im 37/73  soft-tissue]
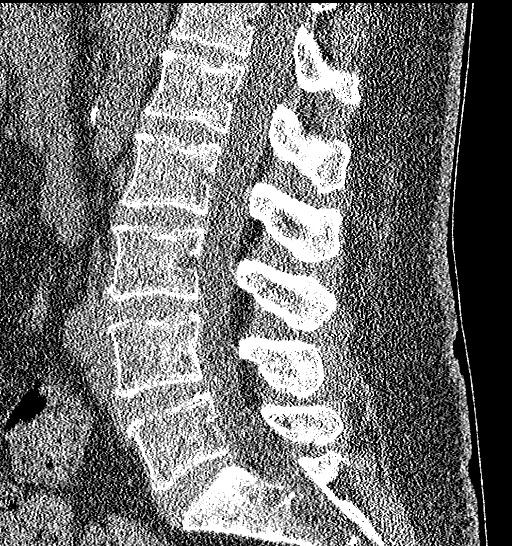
[im 37/73  bone]
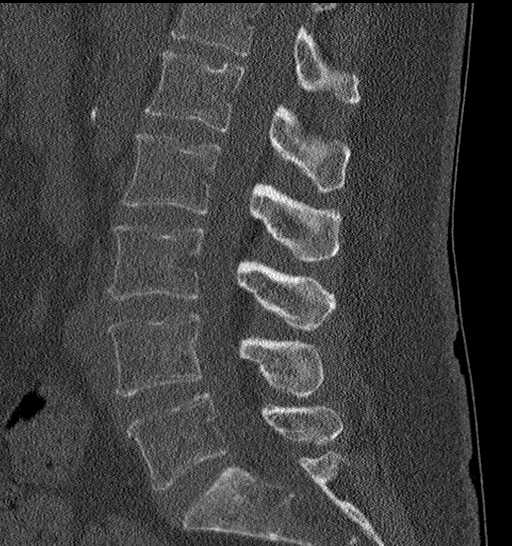
[im 43/73  bone]
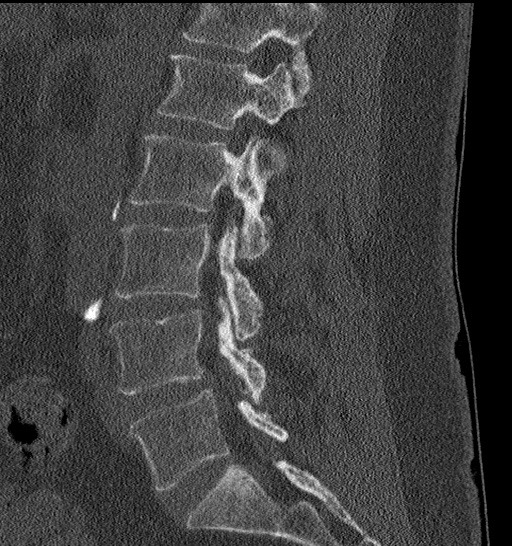
[im 49/73  bone]
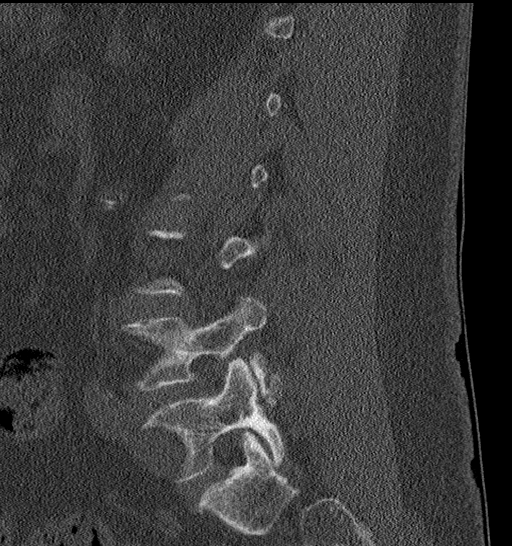

[Series 6: coronal bone · coronal · 0.37mm/px · 3 of 83 slices shown]
[im 17/83  bone]
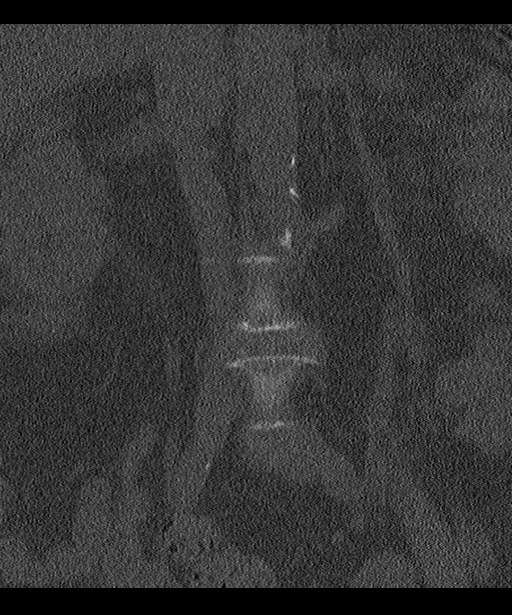
[im 33/83  bone]
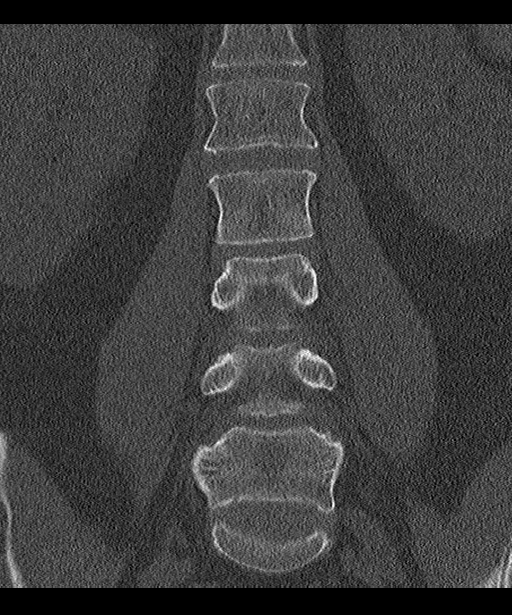
[im 50/83  bone]
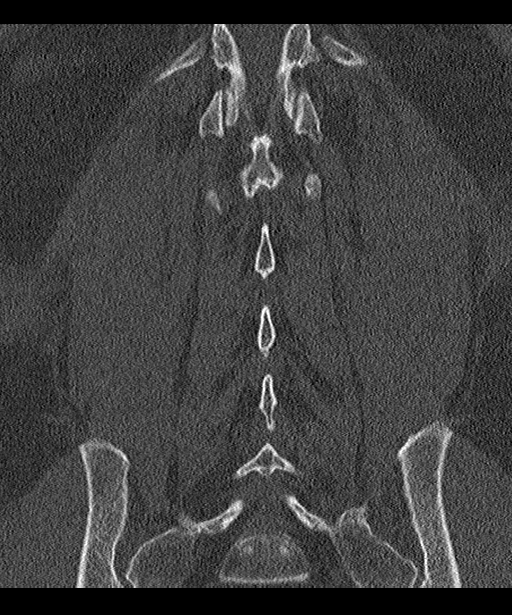

[14 of 33 positions shown; findings below may reference images not displayed]

FINDINGS: Segmentation: 5 lumbar vertebrae. The caudal most well-formed
intervertebral disc space is designated L5-S1.

Alignment: No significant spondylolisthesis.

Vertebrae: No evidence of fracture to the lumbar spine. Small
multilevel Schmorl nodes.

Paraspinal and other soft tissues: 2.1 cm exophytic lesion arising
from the left kidney measuring 33 Hounsfield units (series 4, image
7). Aortoiliac atherosclerosis. Paraspinal soft tissues
unremarkable.

Disc levels:

No more than mild disc space narrowing at any level.

T12-L1: Small disc bulge. No appreciable significant spinal canal or
foraminal stenosis.

L1-L2: Small disc bulge. No appreciable significant spinal canal or
foraminal stenosis.

L2-L3: Small disc bulge. Mild facet arthrosis. No appreciable
significant spinal canal or foraminal stenosis.

L3-L4: Disc bulge. Facet arthrosis/ligamentum flavum hypertrophy.
Apparent mild central canal stenosis with bilateral subarticular
narrowing. Mild bilateral neural foraminal narrowing.

L4-L5: Disc bulge. Facet arthrosis/ligamentum flavum hypertrophy.
Apparent mild central canal stenosis with bilateral subarticular
narrowing. Bilateral neural foraminal narrowing (mild right,
moderate left).

L5-S1: Disc bulge. Facet arthrosis. No appreciable significant
spinal canal stenosis or neural foraminal narrowing.
IMPRESSION: No evidence of acute fracture to the lumbar spine.

Lumbar spondylosis, as outlined and most notably as follows.

At L3-L4, there is a disc bulge. Facet arthrosis/ligamentum flavum
hypertrophy. Apparent mild central canal stenosis with bilateral
subarticular narrowing. Mild bilateral neural foraminal narrowing.

At L4-L5, there is a disc bulge. Facet arthrosis/ligamentum flavum
hypertrophy. Apparent mild central canal stenosis with bilateral
subarticular narrowing. Bilateral neural foraminal narrowing (mild
right, moderate left).

2.1 cm exophytic lesion arising from the left kidney measuring 33
Hounsfield units in density. While this could reflect an exophytic
hemorrhagic or proteinaceous renal cyst, a renal cell carcinoma
cannot be excluded and a nonemergent renal protocol
contrast-enhanced abdominal MRI is recommended for further
evaluation.

Aortic Atherosclerosis ([id number].T).

## 2023-07-31 ENCOUNTER — Other Ambulatory Visit: Payer: Self-pay | Admitting: Neurological Surgery

## 2023-08-08 ENCOUNTER — Other Ambulatory Visit: Payer: Self-pay | Admitting: Internal Medicine

## 2023-08-08 ENCOUNTER — Ambulatory Visit (INDEPENDENT_AMBULATORY_CARE_PROVIDER_SITE_OTHER): Payer: Medicare Other | Admitting: Obstetrics & Gynecology

## 2023-08-08 ENCOUNTER — Encounter: Payer: Self-pay | Admitting: Obstetrics & Gynecology

## 2023-08-08 VITALS — BP 137/89 | HR 76 | Ht 63.0 in | Wt 195.0 lb

## 2023-08-08 DIAGNOSIS — Z4689 Encounter for fitting and adjustment of other specified devices: Secondary | ICD-10-CM

## 2023-08-08 DIAGNOSIS — N8111 Cystocele, midline: Secondary | ICD-10-CM

## 2023-08-08 DIAGNOSIS — N952 Postmenopausal atrophic vaginitis: Secondary | ICD-10-CM

## 2023-08-08 DIAGNOSIS — Z1231 Encounter for screening mammogram for malignant neoplasm of breast: Secondary | ICD-10-CM

## 2023-08-08 NOTE — Progress Notes (Signed)
     GYN VISIT Patient name: Bonnie Warren MRN [id number]  Date of birth: 03-Mar-1966 Chief Complaint:    Chief Complaint  Patient presents with   Pessary Check   History of Present Illness:   Bonnie Warren is a 57 y.o. G2P2002 PM female being seen today for pessary maintenance.     She uses a Milex ring #3 She reports no vaginal discharge and no vaginal bleeding   Likert scale(1 not bothersome -5 very bothersome)  :  1  She notes that the pessary has been working well for her.  She took the pessary out herself about a month ago because she thought it was due to be cleaned.  She was afraid she would put it back in wrong so she didn't  She has back surgery scheduled in December and wants to make sure the pessary is in prior to surgery  Review of Systems:   Pertinent items are noted in HPI Denies fever/chills, dizziness, headaches, visual disturbances, fatigue, shortness of breath, chest pain, abdominal pain, vomiting. Pertinent History Reviewed:  Reviewed past medical,surgical, social, obstetrical and family history.  Reviewed problem list, medications and allergies. Physical Assessment:   Vitals:   08/08/23 1020  BP: 137/89  Pulse: 76  Weight: 195 lb (88.5 kg)  Height: 5\' 3"  (1.6 m)  Body mass index is 34.54 kg/m.       Physical Examination:   General appearance: alert, well appearing, and in no distress  Psych: mood appropriate, normal affect  Skin: warm & dry   Cardiovascular: normal heart rate noted  Respiratory: normal respiratory effort, no distress  Abdomen: soft, non-tender   GU: normal external genitalia.    Vagina: Exam reveals no undue vaginal mucosal pressure of breakdown, no discharge and no vaginal bleeding.  Vaginal Epithelial Abnormality Classification System:   0 0    No abnormalities 1    Epithelial erythema 2    Granulation tissue 3    Epithelial break or erosion, 1 cm or less 4    Epithelial break or erosion, 1 cm or  greater   Pessary placed without difficulty.   Extremities: no edema   Chaperone:  pt declined     Assessment & Plan:     ICD-10-CM   1. Pessary maintenance  Z46.89     2. Vaginal atrophy  N95.2     3. 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   Myna Hidalgo, DO Attending Obstetrician & Gynecologist, [facility] for [employer], [facility]

## 2023-08-16 NOTE — Pre-Procedure Instructions (Signed)
Surgical Instructions   Your procedure is scheduled on August 27, 2023. Report to [facility] Main Entrance "A" at 8:30 A.M., then check in with the Admitting office. Any questions or running late day of surgery: call [phone]  Questions prior to your surgery date: call [phone], Monday-Friday, 8am-4pm. If you experience any cold or flu symptoms such as cough, fever, chills, shortness of breath, etc. between now and your scheduled surgery, please notify us at the above number.     Remember:  Do not eat or drink after midnight the night before your surgery    Take these medicines the morning of surgery with A SIP OF WATER: gemfibrozil (LOPID)    May take these medicines IF NEEDED: diazepam (VALIUM)  Polyethyl Glycol-Propyl Glycol (SYSTANE OP) eye drops   One week prior to surgery, STOP taking any Aspirin (unless otherwise instructed by your surgeon) Aleve, Naproxen, Ibuprofen, Motrin, Advil, Goody's, BC's, all herbal medications, fish oil, and non-prescription vitamins.                     Do NOT Smoke (Tobacco/Vaping) for 24 hours prior to your procedure.  If you use a CPAP at night, you may bring your mask/headgear for your overnight stay.   You will be asked to remove any contacts, glasses, piercing's, hearing aid's, dentures/partials prior to surgery. Please bring cases for these items if needed.    Patients discharged the day of surgery will not be allowed to drive home, and someone needs to stay with them for 24 hours.  SURGICAL WAITING ROOM VISITATION Patients may have no more than 2 support people in the waiting area - these visitors may rotate.   Pre-op nurse will coordinate an appropriate time for 1 ADULT support person, who may not rotate, to accompany patient in pre-op.  Children under the age of 47 must have an adult with them who is not the patient and must remain in the main waiting area with an adult.  If the patient needs to stay at the hospital during  part of their recovery, the visitor guidelines for inpatient rooms apply.  Please refer to the [facility] website for the visitor guidelines for any additional information.   If you received a COVID test during your pre-op visit  it is requested that you wear a mask when out in public, stay away from anyone that may not be feeling well and notify your surgeon if you develop symptoms. If you have been in contact with anyone that has tested positive in the last 10 days please notify you surgeon.      Pre-operative 5 CHG Bathing Instructions   You can play a key role in reducing the risk of infection after surgery. Your skin needs to be as free of germs as possible. You can reduce the number of germs on your skin by washing with CHG (chlorhexidine gluconate) soap before surgery. CHG is an antiseptic soap that kills germs and continues to kill germs even after washing.   DO NOT use if you have an allergy to chlorhexidine/CHG or antibacterial soaps. If your skin becomes reddened or irritated, stop using the CHG and notify one of our RNs at [phone].   Please shower with the CHG soap starting 4 days before surgery using the following schedule:     Please keep in mind the following:  DO NOT shave, including legs and underarms, starting the day of your first shower.   You may shave your face at  any point before/day of surgery.  Place clean sheets on your bed the day you start using CHG soap. Use a clean washcloth (not used since being washed) for each shower. DO NOT sleep with pets once you start using the CHG.   CHG Shower Instructions:  Wash your face and private area with normal soap. If you choose to wash your hair, wash first with your normal shampoo.  After you use shampoo/soap, rinse your hair and body thoroughly to remove shampoo/soap residue.  Turn the water OFF and apply about 3 tablespoons (45 ml) of CHG soap to a CLEAN washcloth.  Apply CHG soap ONLY FROM YOUR NECK DOWN TO  YOUR TOES (washing for 3-5 minutes)  DO NOT use CHG soap on face, private areas, open wounds, or sores.  Pay special attention to the area where your surgery is being performed.  If you are having back surgery, having someone wash your back for you may be helpful. Wait 2 minutes after CHG soap is applied, then you may rinse off the CHG soap.  Pat dry with a clean towel  Put on clean clothes/pajamas   If you choose to wear lotion, please use ONLY the CHG-compatible lotions on the back of this paper.   Additional instructions for the day of surgery: DO NOT APPLY any lotions, deodorants, cologne, or perfumes.   Do not bring valuables to the hospital. [facility] is not responsible for any belongings/valuables. Do not wear nail polish, gel polish, artificial nails, or any other type of covering on natural nails (fingers and toes) Do not wear jewelry or makeup Put on clean/comfortable clothes.  Please brush your teeth.  Ask your nurse before applying any prescription medications to the skin.     CHG Compatible Lotions   Aveeno Moisturizing lotion  Cetaphil Moisturizing Cream  Cetaphil Moisturizing Lotion  Clairol Herbal Essence Moisturizing Lotion, Dry Skin  Clairol Herbal Essence Moisturizing Lotion, Extra Dry Skin  Clairol Herbal Essence Moisturizing Lotion, Normal Skin  Curel Age Defying Therapeutic Moisturizing Lotion with Alpha Hydroxy  Curel Extreme Care Body Lotion  Curel Soothing Hands Moisturizing Hand Lotion  Curel Therapeutic Moisturizing Cream, Fragrance-Free  Curel Therapeutic Moisturizing Lotion, Fragrance-Free  Curel Therapeutic Moisturizing Lotion, Original Formula  Eucerin Daily Replenishing Lotion  Eucerin Dry Skin Therapy Plus Alpha Hydroxy Crme  Eucerin Dry Skin Therapy Plus Alpha Hydroxy Lotion  Eucerin Original Crme  Eucerin Original Lotion  Eucerin Plus Crme Eucerin Plus Lotion  Eucerin TriLipid Replenishing Lotion  Keri Anti-Bacterial Hand Lotion  Keri  Deep Conditioning Original Lotion Dry Skin Formula Softly Scented  Keri Deep Conditioning Original Lotion, Fragrance Free Sensitive Skin Formula  Keri Lotion Fast Absorbing Fragrance Free Sensitive Skin Formula  Keri Lotion Fast Absorbing Softly Scented Dry Skin Formula  Keri Original Lotion  Keri Skin Renewal Lotion Keri Silky Smooth Lotion  Keri Silky Smooth Sensitive Skin Lotion  Nivea Body Creamy Conditioning Oil  Nivea Body Extra Enriched Lotion  Nivea Body Original Lotion  Nivea Body Sheer Moisturizing Lotion Nivea Crme  Nivea Skin Firming Lotion  NutraDerm 30 Skin Lotion  NutraDerm Skin Lotion  NutraDerm Therapeutic Skin Cream  NutraDerm Therapeutic Skin Lotion  ProShield Protective Hand Cream  Provon moisturizing lotion  Please read over the following fact sheets that you were given.

## 2023-08-19 ENCOUNTER — Encounter (HOSPITAL_COMMUNITY): Payer: Self-pay

## 2023-08-19 ENCOUNTER — Other Ambulatory Visit: Payer: Self-pay

## 2023-08-19 ENCOUNTER — Encounter (HOSPITAL_COMMUNITY)
Admission: RE | Admit: 2023-08-19 | Discharge: 2023-08-19 | Disposition: A | Discharge status: Home / Self Care | Payer: Medicare Other | Source: Ambulatory Visit | Attending: Neurological Surgery | Admitting: Neurological Surgery

## 2023-08-19 VITALS — BP 117/73 | HR 73 | Temp 98.4°F | Resp 18 | Ht 63.0 in | Wt 193.4 lb

## 2023-08-19 DIAGNOSIS — Z01818 Encounter for other preprocedural examination: Secondary | ICD-10-CM | POA: Insufficient documentation

## 2023-08-19 DIAGNOSIS — I2510 Atherosclerotic heart disease of native coronary artery without angina pectoris: Secondary | ICD-10-CM | POA: Diagnosis not present

## 2023-08-19 HISTORY — DX: Pneumonia, unspecified organism: J18.9

## 2023-08-19 LAB — BASIC METABOLIC PANEL
Anion gap: 10 (ref 5–15)
BUN: 17 mg/dL (ref 6–20)
CO2: 24 mmol/L (ref 22–32)
Calcium: 9.5 mg/dL (ref 8.9–10.3)
Chloride: 105 mmol/L (ref 98–111)
Creatinine, Ser: 0.79 mg/dL (ref 0.44–1.00)
GFR, Estimated: >60 mL/min (ref >60)
Glucose, Bld: 94 mg/dL (ref 70–99)
Potassium: 4.3 mmol/L (ref 3.5–5.1)
Sodium: 139 mmol/L (ref 135–145)

## 2023-08-19 LAB — CBC
HCT: 38.4 % (ref 36.0–46.0)
Hemoglobin: 13.3 g/dL (ref 12.0–15.0)
MCH: 32.2 pg (ref 26.0–34.0)
MCHC: 34.6 g/dL (ref 30.0–36.0)
MCV: 93 fL (ref 80.0–100.0)
Platelets: 253 10*3/uL (ref 150–400)
RBC: 4.13 MIL/uL (ref 3.87–5.11)
RDW: 12.6 % (ref 11.5–15.5)
WBC: 6.9 10*3/uL (ref 4.0–10.5)
nRBC: 0 % (ref 0.0–0.2)

## 2023-08-19 LAB — SURGICAL PCR SCREEN
MRSA, PCR: NEGATIVE
Staphylococcus aureus: NEGATIVE

## 2023-08-19 NOTE — Progress Notes (Signed)
PCP - Dr. Doreen Beam Cardiologist - Per pt, she saw one when in grade school for heart murmur, but no mention of murmur since then  PPM/ICD - Denies Device Orders - n/a Rep Notified - n/a  Chest x-ray - 04/01/2023 EKG - 08/19/2023 Stress Test - Denies ECHO - Denies Cardiac Cath - Denies  Sleep Study - Denies CPAP - n/a  No DM  Last dose of GLP1 agonist- n/a GLP1 instructions: n/a  Blood Thinner Instructions: n/a Aspirin Instructions: n/a  NPO after midnight  COVID TEST- n/a   Anesthesia review: No.   Patient denies shortness of breath, fever, cough and chest pain at PAT appointment. Pt denies any respiratory illness/infection in the last two months.    All instructions explained to the patient, with a verbal understanding of the material. Patient agrees to go over the instructions while at home for a better understanding. Patient also instructed to self quarantine after being tested for COVID-19. The opportunity to ask questions was provided.

## 2023-08-26 NOTE — Progress Notes (Signed)
Pt and friend made aware of surgery time change for Tues 08/27/23 1008-1159, arrival 0800, and to follow all previous instructions.

## 2023-08-27 ENCOUNTER — Ambulatory Visit (HOSPITAL_COMMUNITY): Payer: Medicare Other

## 2023-08-27 ENCOUNTER — Observation Stay (HOSPITAL_COMMUNITY)
Admission: RE | Admit: 2023-08-27 | Discharge: 2023-08-28 | Disposition: A | Discharge status: Home / Self Care | Payer: Medicare Other | Source: Ambulatory Visit | Attending: Neurological Surgery | Admitting: Neurological Surgery

## 2023-08-27 ENCOUNTER — Other Ambulatory Visit: Payer: Self-pay

## 2023-08-27 ENCOUNTER — Ambulatory Visit (HOSPITAL_COMMUNITY): Payer: Medicare Other | Admitting: Anesthesiology

## 2023-08-27 ENCOUNTER — Other Ambulatory Visit: Payer: Self-pay | Admitting: Neurological Surgery

## 2023-08-27 ENCOUNTER — Ambulatory Visit (HOSPITAL_BASED_OUTPATIENT_CLINIC_OR_DEPARTMENT_OTHER): Payer: Medicare Other | Admitting: Anesthesiology

## 2023-08-27 ENCOUNTER — Encounter (HOSPITAL_COMMUNITY): Payer: Self-pay | Admitting: Neurological Surgery

## 2023-08-27 ENCOUNTER — Other Ambulatory Visit (HOSPITAL_COMMUNITY): Payer: Self-pay

## 2023-08-27 ENCOUNTER — Encounter (HOSPITAL_COMMUNITY)
Admission: RE | Disposition: A | Discharge status: Home / Self Care | Payer: Self-pay | Source: Ambulatory Visit | Attending: Neurological Surgery

## 2023-08-27 DIAGNOSIS — M5416 Radiculopathy, lumbar region: Secondary | ICD-10-CM

## 2023-08-27 DIAGNOSIS — R2681 Unsteadiness on feet: Secondary | ICD-10-CM | POA: Insufficient documentation

## 2023-08-27 DIAGNOSIS — M5116 Intervertebral disc disorders with radiculopathy, lumbar region: Secondary | ICD-10-CM | POA: Diagnosis present

## 2023-08-27 DIAGNOSIS — Z79899 Other long term (current) drug therapy: Secondary | ICD-10-CM | POA: Insufficient documentation

## 2023-08-27 DIAGNOSIS — I10 Essential (primary) hypertension: Secondary | ICD-10-CM | POA: Diagnosis not present

## 2023-08-27 HISTORY — DX: Radiculopathy, lumbar region: M54.16

## 2023-08-27 HISTORY — PX: LUMBAR LAMINECTOMY/ DECOMPRESSION WITH MET-RX: SHX5959

## 2023-08-27 SURGERY — LUMBAR LAMINECTOMY/ DECOMPRESSION WITH MET-RX
Anesthesia: General | Site: Spine Lumbar | Laterality: Right

## 2023-08-27 MED ORDER — ONDANSETRON HCL 4 MG/2ML IJ SOLN: 4.0000 mg | Freq: Four times a day (QID) | INTRAMUSCULAR | Status: DC | PRN

## 2023-08-27 MED ORDER — ACETAMINOPHEN 325 MG PO TABS
650.0000 mg | ORAL_TABLET | ORAL | Status: DC | PRN
  Administered 2023-08-28: 650 mg via ORAL
  Filled 2023-08-27: qty 2

## 2023-08-27 MED ORDER — ROCURONIUM BROMIDE 10 MG/ML (PF) SYRINGE
PREFILLED_SYRINGE | INTRAVENOUS | Status: AC
  Filled 2023-08-27: qty 10

## 2023-08-27 MED ORDER — PROPOFOL 10 MG/ML IV BOLUS
INTRAVENOUS | Status: DC | PRN
  Administered 2023-08-27: 170 mg via INTRAVENOUS
  Administered 2023-08-27: 50 ug/kg/min via INTRAVENOUS

## 2023-08-27 MED ORDER — PHENYLEPHRINE HCL-NACL 20-0.9 MG/250ML-% IV SOLN
INTRAVENOUS | Status: DC | PRN
  Administered 2023-08-27: 30 ug/min via INTRAVENOUS

## 2023-08-27 MED ORDER — METHYLPREDNISOLONE ACETATE 80 MG/ML IJ SUSP
INTRAMUSCULAR | Status: AC
  Filled 2023-08-27: qty 1

## 2023-08-27 MED ORDER — PHENOL 1.4 % MT LIQD: 1.0000 | OROMUCOSAL | Status: DC | PRN

## 2023-08-27 MED ORDER — CELECOXIB 200 MG PO CAPS
200.0000 mg | ORAL_CAPSULE | Freq: Once | ORAL | Status: AC
Start: 2023-08-27 — End: 2023-08-27
  Administered 2023-08-27: 200 mg via ORAL
  Filled 2023-08-27: qty 1

## 2023-08-27 MED ORDER — ACETAMINOPHEN 650 MG RE SUPP: 650.0000 mg | RECTAL | Status: DC | PRN

## 2023-08-27 MED ORDER — ALPRAZOLAM 0.5 MG PO TABS: 0.5000 mg | ORAL_TABLET | Freq: Every evening | ORAL | Status: DC | PRN

## 2023-08-27 MED ORDER — SUGAMMADEX SODIUM 200 MG/2ML IV SOLN
INTRAVENOUS | Status: DC | PRN
  Administered 2023-08-27: 200 mg via INTRAVENOUS

## 2023-08-27 MED ORDER — PROPOFOL 10 MG/ML IV BOLUS
INTRAVENOUS | Status: AC
  Filled 2023-08-27: qty 20

## 2023-08-27 MED ORDER — DEXAMETHASONE SODIUM PHOSPHATE 10 MG/ML IJ SOLN
INTRAMUSCULAR | Status: DC | PRN
  Administered 2023-08-27: 10 mg via INTRAVENOUS

## 2023-08-27 MED ORDER — CHLORHEXIDINE GLUCONATE CLOTH 2 % EX PADS: 6.0000 | MEDICATED_PAD | Freq: Once | CUTANEOUS | Status: DC

## 2023-08-27 MED ORDER — ACETAMINOPHEN 500 MG PO TABS
1000.0000 mg | ORAL_TABLET | Freq: Once | ORAL | Status: AC
  Administered 2023-08-27: 1000 mg via ORAL
  Filled 2023-08-27: qty 2

## 2023-08-27 MED ORDER — MENTHOL 3 MG MT LOZG: 1.0000 | LOZENGE | OROMUCOSAL | Status: DC | PRN

## 2023-08-27 MED ORDER — LIDOCAINE 2% (20 MG/ML) 5 ML SYRINGE
INTRAMUSCULAR | Status: DC | PRN
  Administered 2023-08-27: 60 mg via INTRAVENOUS

## 2023-08-27 MED ORDER — ALPRAZOLAM 0.5 MG PO TABS
0.5000 mg | ORAL_TABLET | Freq: Three times a day (TID) | ORAL | Status: DC | PRN
  Administered 2023-08-27: 0.5 mg via ORAL
  Filled 2023-08-27: qty 1

## 2023-08-27 MED ORDER — METHOCARBAMOL 500 MG PO TABS
500.0000 mg | ORAL_TABLET | Freq: Four times a day (QID) | ORAL | Status: DC | PRN
  Administered 2023-08-28: 500 mg via ORAL
  Filled 2023-08-27: qty 1

## 2023-08-27 MED ORDER — LIDOCAINE-EPINEPHRINE 1 %-1:100000 IJ SOLN
INTRAMUSCULAR | Status: AC
  Filled 2023-08-27: qty 1

## 2023-08-27 MED ORDER — ONDANSETRON HCL 4 MG/2ML IJ SOLN
INTRAMUSCULAR | Status: AC
  Filled 2023-08-27: qty 2

## 2023-08-27 MED ORDER — FENTANYL CITRATE (PF) 100 MCG/2ML IJ SOLN
INTRAMUSCULAR | Status: AC
  Filled 2023-08-27: qty 2

## 2023-08-27 MED ORDER — KETOROLAC TROMETHAMINE 15 MG/ML IJ SOLN
7.5000 mg | Freq: Four times a day (QID) | INTRAMUSCULAR | Status: AC
  Administered 2023-08-27 – 2023-08-28 (×3): 7.5 mg via INTRAVENOUS
  Filled 2023-08-27 (×3): qty 1

## 2023-08-27 MED ORDER — HYDROCODONE-ACETAMINOPHEN 10-325 MG PO TABS
1.0000 | ORAL_TABLET | ORAL | Status: DC | PRN
Start: 2023-08-27 — End: 2023-08-28
  Administered 2023-08-27 – 2023-08-28 (×5): 1 via ORAL
  Filled 2023-08-27 (×5): qty 1

## 2023-08-27 MED ORDER — FENTANYL CITRATE (PF) 250 MCG/5ML IJ SOLN
INTRAMUSCULAR | Status: AC
  Filled 2023-08-27: qty 5

## 2023-08-27 MED ORDER — CHLORHEXIDINE GLUCONATE 0.12 % MT SOLN
15.0000 mL | Freq: Once | OROMUCOSAL | Status: AC
  Administered 2023-08-27: 15 mL via OROMUCOSAL
  Filled 2023-08-27: qty 15

## 2023-08-27 MED ORDER — THROMBIN 5000 UNITS EX SOLR
OROMUCOSAL | Status: DC | PRN
  Administered 2023-08-27: 5 mL via TOPICAL

## 2023-08-27 MED ORDER — LACTATED RINGERS IV SOLN: INTRAVENOUS | Status: DC

## 2023-08-27 MED ORDER — ROSUVASTATIN CALCIUM 5 MG PO TABS
10.0000 mg | ORAL_TABLET | Freq: Every day | ORAL | Status: DC
Start: 2023-08-27 — End: 2023-08-28
  Administered 2023-08-27: 10 mg via ORAL
  Filled 2023-08-27: qty 2

## 2023-08-27 MED ORDER — HYDROCODONE-ACETAMINOPHEN 5-325 MG PO TABS
1.0000 | ORAL_TABLET | ORAL | 0 refills | Status: AC | PRN
  Filled 2023-08-27: qty 20, 4d supply, fill #0

## 2023-08-27 MED ORDER — ROCURONIUM BROMIDE 10 MG/ML (PF) SYRINGE
PREFILLED_SYRINGE | INTRAVENOUS | Status: DC | PRN
  Administered 2023-08-27: 60 mg via INTRAVENOUS

## 2023-08-27 MED ORDER — METHYLPREDNISOLONE ACETATE 80 MG/ML IJ SUSP
INTRAMUSCULAR | Status: DC | PRN
  Administered 2023-08-27: 40 mg

## 2023-08-27 MED ORDER — FENTANYL CITRATE (PF) 100 MCG/2ML IJ SOLN
25.0000 ug | INTRAMUSCULAR | Status: DC | PRN
  Administered 2023-08-27 (×2): 50 ug via INTRAVENOUS

## 2023-08-27 MED ORDER — METHOCARBAMOL 1000 MG/10ML IJ SOLN
500.0000 mg | Freq: Four times a day (QID) | INTRAMUSCULAR | Status: DC | PRN
  Administered 2023-08-27: 500 mg via INTRAVENOUS

## 2023-08-27 MED ORDER — LIDOCAINE 2% (20 MG/ML) 5 ML SYRINGE
INTRAMUSCULAR | Status: AC
  Filled 2023-08-27: qty 5

## 2023-08-27 MED ORDER — CEFAZOLIN SODIUM-DEXTROSE 2-4 GM/100ML-% IV SOLN
2.0000 g | INTRAVENOUS | Status: AC
  Administered 2023-08-27: 2 g via INTRAVENOUS
  Filled 2023-08-27: qty 100

## 2023-08-27 MED ORDER — HYDROCHLOROTHIAZIDE 12.5 MG PO TABS
12.5000 mg | ORAL_TABLET | Freq: Every day | ORAL | Status: DC
  Administered 2023-08-27: 12.5 mg via ORAL
  Filled 2023-08-27 (×2): qty 1

## 2023-08-27 MED ORDER — LIDOCAINE-EPINEPHRINE 1 %-1:100000 IJ SOLN
INTRAMUSCULAR | Status: DC | PRN
  Administered 2023-08-27: 5 mL

## 2023-08-27 MED ORDER — SODIUM CHLORIDE 0.9% FLUSH: 3.0000 mL | INTRAVENOUS | Status: DC | PRN

## 2023-08-27 MED ORDER — LISINOPRIL 20 MG PO TABS
20.0000 mg | ORAL_TABLET | Freq: Every day | ORAL | Status: DC
  Administered 2023-08-27: 20 mg via ORAL
  Filled 2023-08-27 (×2): qty 1

## 2023-08-27 MED ORDER — GEMFIBROZIL 600 MG PO TABS
600.0000 mg | ORAL_TABLET | Freq: Every day | ORAL | Status: DC
  Administered 2023-08-27 – 2023-08-28 (×2): 600 mg via ORAL
  Filled 2023-08-27 (×2): qty 1

## 2023-08-27 MED ORDER — SODIUM CHLORIDE 0.9 % IV SOLN: 250.0000 mL | INTRAVENOUS | Status: DC

## 2023-08-27 MED ORDER — DEXAMETHASONE SODIUM PHOSPHATE 10 MG/ML IJ SOLN
INTRAMUSCULAR | Status: AC
  Filled 2023-08-27: qty 1

## 2023-08-27 MED ORDER — ONDANSETRON HCL 4 MG PO TABS: 4.0000 mg | ORAL_TABLET | Freq: Four times a day (QID) | ORAL | Status: DC | PRN

## 2023-08-27 MED ORDER — ORAL CARE MOUTH RINSE: 15.0000 mL | Freq: Once | OROMUCOSAL | Status: AC

## 2023-08-27 MED ORDER — BUPIVACAINE-EPINEPHRINE (PF) 0.5% -1:200000 IJ SOLN
INTRAMUSCULAR | Status: DC | PRN
  Administered 2023-08-27: 5 mL

## 2023-08-27 MED ORDER — 0.9 % SODIUM CHLORIDE (POUR BTL) OPTIME
TOPICAL | Status: DC | PRN
  Administered 2023-08-27: 1000 mL

## 2023-08-27 MED ORDER — HYDROCODONE-ACETAMINOPHEN 10-325 MG PO TABS: 2.0000 | ORAL_TABLET | ORAL | Status: DC | PRN

## 2023-08-27 MED ORDER — MIDAZOLAM HCL 2 MG/2ML IJ SOLN
INTRAMUSCULAR | Status: DC | PRN
  Administered 2023-08-27: 2 mg via INTRAVENOUS

## 2023-08-27 MED ORDER — FENTANYL CITRATE (PF) 100 MCG/2ML IJ SOLN
INTRAMUSCULAR | Status: DC | PRN
  Administered 2023-08-27: 50 ug via INTRAVENOUS

## 2023-08-27 MED ORDER — KETOROLAC TROMETHAMINE 15 MG/ML IJ SOLN
INTRAMUSCULAR | Status: AC
  Filled 2023-08-27: qty 1

## 2023-08-27 MED ORDER — ONDANSETRON HCL 4 MG/2ML IJ SOLN
INTRAMUSCULAR | Status: DC | PRN
  Administered 2023-08-27: 4 mg via INTRAVENOUS

## 2023-08-27 MED ORDER — FENTANYL CITRATE (PF) 250 MCG/5ML IJ SOLN
INTRAMUSCULAR | Status: DC | PRN
  Administered 2023-08-27: 100 ug via INTRAVENOUS

## 2023-08-27 MED ORDER — BUPIVACAINE-EPINEPHRINE (PF) 0.5% -1:200000 IJ SOLN
INTRAMUSCULAR | Status: AC
  Filled 2023-08-27: qty 30

## 2023-08-27 MED ORDER — PHENYLEPHRINE 80 MCG/ML (10ML) SYRINGE FOR IV PUSH (FOR BLOOD PRESSURE SUPPORT)
PREFILLED_SYRINGE | INTRAVENOUS | Status: DC | PRN
  Administered 2023-08-27: 120 ug via INTRAVENOUS

## 2023-08-27 MED ORDER — METHOCARBAMOL 1000 MG/10ML IJ SOLN
INTRAMUSCULAR | Status: AC
  Filled 2023-08-27: qty 10

## 2023-08-27 MED ORDER — METHOCARBAMOL 750 MG PO TABS
750.0000 mg | ORAL_TABLET | Freq: Four times a day (QID) | ORAL | 0 refills | Status: AC | PRN
  Filled 2023-08-27: qty 120, 30d supply, fill #0

## 2023-08-27 MED ORDER — MIDAZOLAM HCL 2 MG/2ML IJ SOLN
INTRAMUSCULAR | Status: AC
  Filled 2023-08-27: qty 2

## 2023-08-27 MED ORDER — THROMBIN 5000 UNITS EX SOLR
CUTANEOUS | Status: AC
  Filled 2023-08-27: qty 5000

## 2023-08-27 MED ORDER — SODIUM CHLORIDE 0.9% FLUSH
3.0000 mL | Freq: Two times a day (BID) | INTRAVENOUS | Status: DC
  Administered 2023-08-27 – 2023-08-28 (×2): 3 mL via INTRAVENOUS

## 2023-08-27 MED ORDER — MORPHINE SULFATE (PF) 2 MG/ML IV SOLN: 2.0000 mg | INTRAVENOUS | Status: DC | PRN

## 2023-08-27 MED ORDER — CEFAZOLIN SODIUM-DEXTROSE 2-4 GM/100ML-% IV SOLN
2.0000 g | Freq: Four times a day (QID) | INTRAVENOUS | Status: AC
  Administered 2023-08-27 – 2023-08-28 (×2): 2 g via INTRAVENOUS
  Filled 2023-08-27 (×2): qty 100

## 2023-08-27 SURGICAL SUPPLY — 55 items
BAG COUNTER SPONGE SURGICOUNT (BAG) ×2 IMPLANT
BLADE SURG 11 STRL SS (BLADE) ×2 IMPLANT
BUR MATCHSTICK NEURO 3.0 LAGG (BURR) ×2 IMPLANT
CNTNR URN SCR LID CUP LEK RST (MISCELLANEOUS) IMPLANT
COVER BACK TABLE 60X90IN (DRAPES) ×2 IMPLANT
COVER MAYO STAND STRL (DRAPES) ×2 IMPLANT
DERMABOND ADVANCED .7 DNX12 (GAUZE/BANDAGES/DRESSINGS) ×2 IMPLANT
DRAIN JACKSON RD 7FR 3/32 (WOUND CARE) IMPLANT
DRAPE C-ARM 42X72 X-RAY (DRAPES) ×2 IMPLANT
DRAPE LAPAROTOMY 100X72X124 (DRAPES) ×2 IMPLANT
DRAPE MICROSCOPE SLANT 54X150 (MISCELLANEOUS) ×2 IMPLANT
DRAPE SURG 17X23 STRL (DRAPES) ×2 IMPLANT
DRSG OPSITE POSTOP 3X4 (GAUZE/BANDAGES/DRESSINGS) ×2 IMPLANT
DURAPREP 26ML APPLICATOR (WOUND CARE) ×2 IMPLANT
ELECT BLADE 6.5 EXT (BLADE) ×2 IMPLANT
ELECT BLADE INSULATED 6.5IN (ELECTROSURGICAL) ×1
ELECT COATED BLADE 2.86 ST (ELECTRODE) ×2 IMPLANT
ELECT REM PT RETURN 9FT ADLT (ELECTROSURGICAL) ×1
ELECTRODE BLDE INSULATED 6.5IN (ELECTROSURGICAL) ×2 IMPLANT
ELECTRODE REM PT RTRN 9FT ADLT (ELECTROSURGICAL) ×2 IMPLANT
GAUZE 4X4 16PLY ~~LOC~~+RFID DBL (SPONGE) IMPLANT
GLOVE BIO SURGEON STRL SZ7 (GLOVE) ×2 IMPLANT
GLOVE BIOGEL PI IND STRL 7.5 (GLOVE) ×2 IMPLANT
GLOVE BIOGEL PI IND STRL 8 (GLOVE) ×4 IMPLANT
GLOVE ECLIPSE 8.0 STRL XLNG CF (GLOVE) ×4 IMPLANT
GOWN STRL REUS W/ TWL LRG LVL3 (GOWN DISPOSABLE) IMPLANT
GOWN STRL REUS W/ TWL XL LVL3 (GOWN DISPOSABLE) ×4 IMPLANT
GOWN STRL REUS W/TWL 2XL LVL3 (GOWN DISPOSABLE) IMPLANT
HEMOSTAT POWDER KIT SURGIFOAM (HEMOSTASIS) ×2 IMPLANT
KIT BASIN OR (CUSTOM PROCEDURE TRAY) ×2 IMPLANT
KIT POSITION SURG JACKSON T1 (MISCELLANEOUS) ×2 IMPLANT
KIT TURNOVER KIT B (KITS) ×2 IMPLANT
MARKER SKIN DUAL TIP RULER LAB (MISCELLANEOUS) ×2 IMPLANT
NDL HYPO 25X1 1.5 SAFETY (NEEDLE) ×2 IMPLANT
NDL SPNL 18GX3.5 QUINCKE PK (NEEDLE) ×2 IMPLANT
NEEDLE HYPO 25X1 1.5 SAFETY (NEEDLE) ×1 IMPLANT
NEEDLE SPNL 18GX3.5 QUINCKE PK (NEEDLE) ×1 IMPLANT
NS IRRIG 1000ML POUR BTL (IV SOLUTION) ×2 IMPLANT
PACK LAMINECTOMY NEURO (CUSTOM PROCEDURE TRAY) ×2 IMPLANT
PAD ARMBOARD 7.5X6 YLW CONV (MISCELLANEOUS) ×6 IMPLANT
PATTIES SURGICAL .5 X.5 (GAUZE/BANDAGES/DRESSINGS) IMPLANT
PATTIES SURGICAL .5 X1 (DISPOSABLE) IMPLANT
PATTIES SURGICAL 1X1 (DISPOSABLE) IMPLANT
SPIKE FLUID TRANSFER (MISCELLANEOUS) ×2 IMPLANT
SPONGE SURGIFOAM ABS GEL 12-7 (HEMOSTASIS) ×2 IMPLANT
SPONGE SURGIFOAM ABS GEL SZ50 (HEMOSTASIS) IMPLANT
SPONGE T-LAP 4X18 ~~LOC~~+RFID (SPONGE) IMPLANT
STAPLER VISISTAT 35W (STAPLE) IMPLANT
SUT VIC AB 0 CT1 27XBRD ANBCTR (SUTURE) IMPLANT
SUT VIC AB 2-0 CP2 18 (SUTURE) ×2 IMPLANT
SUT VIC AB 3-0 SH 8-18 (SUTURE) ×2 IMPLANT
TOWEL GREEN STERILE (TOWEL DISPOSABLE) IMPLANT
TOWEL GREEN STERILE FF (TOWEL DISPOSABLE) IMPLANT
TRAY FOLEY MTR SLVR 16FR STAT (SET/KITS/TRAYS/PACK) IMPLANT
WATER STERILE IRR 1000ML POUR (IV SOLUTION) ×2 IMPLANT

## 2023-08-27 NOTE — Anesthesia Procedure Notes (Signed)
Procedure Name: Intubation Date/Time: 08/27/2023 11:08 AM  Performed by: Randa Evens, CRNAPre-anesthesia Checklist: Patient identified, Emergency Drugs available, Suction available and Patient being monitored Patient Re-evaluated:Patient Re-evaluated prior to induction Oxygen Delivery Method: Circle System Utilized Preoxygenation: Pre-oxygenation with 100% oxygen Induction Type: IV induction Ventilation: Mask ventilation without difficulty Laryngoscope Size: Mac and 3 Grade View: Grade I Tube type: Oral Tube size: 7.0 mm Number of attempts: 1 Airway Equipment and Method: Stylet and Oral airway Placement Confirmation: ETT inserted through vocal cords under direct vision, positive ETCO2 and breath sounds checked- equal and bilateral Secured at: 22 cm Tube secured with: Tape Dental Injury: Teeth and Oropharynx as per pre-operative assessment

## 2023-08-27 NOTE — Op Note (Signed)
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  Right L4-5 minimally invasive laminectomy, partial facetectomy and microdiscectomy for decompression of nerve root L 5 2. Use of operating microscope 3. Use of intraoperative fluoroscopy  SURGEON: Dr. Kendell Bane C. Odell Choung, DO  ASSISTANT: Patrici Ranks, PA  ANESTHESIA: General Endotracheal  EBL: Minimal  SPECIMENS: None  DRAINS: None  COMPLICATIONS: None  CONDITION: Hemodynamically stable  HISTORY: Bonnie Warren is a 57 y.o. female with complaints of worsening back pain and right lower extremity radiculopathy.  Her pain was severe and causing her difficulty standing and walking.  She had an MRI which revealed a right lateral recess herniated nucleus pulposus at L4-5 with inferior migration causing severe compression of the L5 nerve root.  Given her severe stenosis and symptomatology, I recommended surgical intervention the form of right minimally invasive microdiscectomy at L4-5.  We discussed all risks, benefits and expected outcomes as well as alternatives to treatment.  Informed consent was obtained and witnessed.  PROCEDURE IN DETAIL: After informed consent was obtained and witnessed, the patient was brought to the operating room. After induction of general anesthesia, the patient was positioned on the operative table in the prone position with all pressure points meticulously padded. The skin of the low back was then prepped and draped in the usual sterile fashion. Physician driven timeout was performed.  Under fluoroscopy, the L 4-5 level was identified and marked out on the skin, and after timeout was conducted. Skin incision was then made sharply with a 10 blade and Bovie electrocautery was used to dissect the subcutaneous tissue until the lumbodorsal fascia was identified. The  fascia was then incised using Bovie electrocautery and the lamina at the right L4-5 levels was identified and dissection was carried out in the subperiosteal plane using the Metrx dilators.  An appropriate sized Metrx tube was placed, and intraoperative x-ray was taken to confirm appropriate placement.  The microscope was sterilely draped and brought into the field and used for the remainder of the case.  Using Bovie electrocautery, soft tissue was cleared from the lamina and medial facet.  Using a high-speed drill, laminotomy was completed with a partial medial facetectomy. The ligamentum flavum was then identified and removed and the lateral edge of the thecal sac was identified. This was then traced down to identify the traversing nerve root. Dissection was then carried out superior and lateral to the nerve root to identify the disc herniation. The posterior annulus was then coagulated with bipolar cautery and incised with an 11 blade and using a combination of dissectors, curettes, and ronguers, the herniated disc fragment was removed, which was just under the L5 nerve root tenting it over the herniation. The decompression of the nerve root was confirmed using a dissector.  I could follow the L5 nerve root along the medial pedicle and out its foramen and there was no compression.  Hemostasis was then secured using a combination of morcellized Gelfoam and thrombin and bipolar electrocautery. The wound is irrigated with copious amounts of antibiotic saline irrigation.  The traversing and exiting nerve roots were felt with a Murphy ball probe and noted to be decompressed.  The wound was noted to be excellently hemostatic.  The nerve root was then covered with a long-acting steroid solution and fentanyl.  The Metrx tube was then removed, hemostasis was achieved with bipolar cautery and the wound  is closed in layers using 2-0 Vicryl stitches. The skin was closed using standard skin glue.  Sterile dressing was  applied.  Drapes were taken down.  At the end of the case all sponge, needle, and instrument counts were correct. The patient was then fully transferred to the stretcher, extubated and taken to the postanesthesia care unit in stable hemodynamic condition.

## 2023-08-27 NOTE — Transfer of Care (Signed)
Immediate Anesthesia Transfer of Care Note  Patient: Bonnie Warren  Procedure(s) Performed: Lumbar four-five Minimally Invasive Laminotomy, MICRODISCECTOMY (Right: Spine Lumbar)  Patient Location: PACU  Anesthesia Type:General  Level of Consciousness: awake and alert   Airway & Oxygen Therapy: Patient Spontanous Breathing  Post-op Assessment: Report given to RN  Post vital signs: Reviewed and stable  Last Vitals:  Vitals Value Taken Time  BP 129/79 08/27/23 1215  Temp    Pulse 83 08/27/23 1217  Resp 17 08/27/23 1217  SpO2 100 % 08/27/23 1217  Vitals shown include unfiled device data.  Last Pain:  Vitals:   08/27/23 0755  TempSrc:   PainSc: 0-No pain      Patients Stated Pain Goal: 0 (08/27/23 0755)  Complications: No notable events documented.

## 2023-08-27 NOTE — H&P (Signed)
Providing Compassionate, Quality Care - Together  NEUROSURGERY HISTORY & PHYSICAL   Bonnie Warren is an 57 y.o. female.   Chief Complaint: RLE radiculopathy HPI: This is a 57 year old female with complaints of worsening low back pain and right lower extremity radiculopathy for many months.  Her pain has been 10/10 that radiates down her leg to the top of her foot.  She has had difficulty walking due to her significant pain.  MRI revealed a right L4-5 lateral recess herniated nucleus pulposus with inferior migration causing severe compression of the L5 nerve root.  She presents today for surgical intervention.  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 She has never used smokeless tobacco. She reports current alcohol use. She reports that she does not use drugs.  Allergies: No Known Allergies  Medications Prior to Admission  Medication Sig Dispense Refill   ALPRAZolam (XANAX) 0.5 MG tablet Take 0.5 mg by mouth at bedtime as needed for anxiety.     Cyanocobalamin (B-12 PO) Take 1 tablet by mouth daily.     diazepam (VALIUM) 5 MG tablet Take 5 mg by mouth every 6 (six) hours as needed for anxiety.     gemfibrozil (LOPID) 600 MG tablet Take 600 mg by mouth daily.     hydrochlorothiazide (HYDRODIURIL) 12.5 MG tablet Take 12.5 mg by mouth daily.     lisinopril (ZESTRIL) 20 MG tablet Take 20 mg by mouth daily.     Polyethyl Glycol-Propyl Glycol (SYSTANE OP) Place 1 drop into both eyes as needed (dry eyes).     rosuvastatin (CRESTOR) 10 MG tablet  Take 10 mg by mouth at bedtime.     Vitamin D, Ergocalciferol, (DRISDOL) 1.25 MG (50000 UNIT) CAPS capsule Take 50,000 Units by mouth once a week.     albuterol (VENTOLIN HFA) 108 (90 Base) MCG/ACT inhaler Inhale 2 puffs into the lungs every 4 (four) hours as needed for wheezing or shortness of breath. (Patient not taking: Reported on 08/14/2023) 18 g 0   fluticasone (FLONASE) 50 MCG/ACT nasal spray Place 1 spray into both nostrils daily for 14 days. (Patient not taking: Reported on 08/14/2023) 16 g 0    No results found for this or any previous visit (from the past 48 hour(s)). No results found.  ROS All pertinent positives and negatives are above  Blood pressure 124/80, pulse 85, temperature 98 F (36.7 C), temperature source Oral, resp. rate 18, height 5\' 3"  (1.6 m), weight 88.5 kg, SpO2 97%. Physical Exam  Awake alert oriented x 3, no acute distress PERRLA Nonlabored breathing Speech fluent appropriate Full strength in upper extremities bilaterally Full strength in lower extremities except for right lower extremity TA/DF 4/5 Decreased sensation to light touch in the L5 distribution on the right  Assessment/Plan 57 year old female with  Right L4-5 herniated nucleus pulposus with L5 radiculopathy  -OR today for L4-5 right minimally invasive microdiscectomy.  We discussed  all risks, benefits and expected outcomes as well as alternatives to treatment.  She would like to proceed with surgical intervention.  Informed consent was obtained and witnessed.  Thank you for allowing me to participate in this patient's care.  Please do not hesitate to call with questions or concerns.   Monia Pouch, DO Neurosurgeon [facility] [phone]

## 2023-08-27 NOTE — Anesthesia Preprocedure Evaluation (Signed)
Anesthesia Evaluation  Patient identified by MRN, date of birth, ID band Patient awake    Reviewed: Allergy & Precautions, NPO status , Patient's Chart, lab work & pertinent test results  Airway Mallampati: II  TM Distance: >3 FB Neck ROM: Full    Dental no notable dental hx.    Pulmonary neg pulmonary ROS   Pulmonary exam normal        Cardiovascular hypertension,  Rhythm:Regular Rate:Normal     Neuro/Psych   Anxiety Depression    negative neurological ROS     GI/Hepatic negative GI ROS, Neg liver ROS,,,  Endo/Other  negative endocrine ROS    Renal/GU negative Renal ROS  negative genitourinary   Musculoskeletal  (+) Arthritis , Osteoarthritis,    Abdominal Normal abdominal exam  (+)   Peds  Hematology Lab Results      Component                Value               Date                      WBC                      6.9                 08/19/2023                HGB                      13.3                08/19/2023                HCT                      38.4                08/19/2023                MCV                      93.0                08/19/2023                PLT                      253                 08/19/2023             Lab Results      Component                Value               Date                      NA                       139                 08/19/2023                K  4.3                 08/19/2023                CO2                      24                  08/19/2023                GLUCOSE                  94                  08/19/2023                BUN                      17                  08/19/2023                CREATININE               0.79                08/19/2023                CALCIUM                  9.5                 08/19/2023                GFRNONAA                 >60                 08/19/2023              Anesthesia Other Findings    Reproductive/Obstetrics                             Anesthesia Physical Anesthesia Plan  ASA: 2  Anesthesia Plan: General   Post-op Pain Management: Celebrex PO (pre-op)* and Tylenol PO (pre-op)*   Induction: Intravenous  PONV Risk Score and Plan: 3 and Ondansetron, Dexamethasone, Midazolam and Treatment may vary due to age or medical condition  Airway Management Planned: Mask and Oral ETT  Additional Equipment: None  Intra-op Plan:   Post-operative Plan: Extubation in OR  Informed Consent: I have reviewed the patients History and Physical, chart, labs and discussed the procedure including the risks, benefits and alternatives for the proposed anesthesia with the patient or authorized representative who has indicated his/her understanding and acceptance.     Dental advisory given  Plan Discussed with: CRNA  Anesthesia Plan Comments:        Anesthesia Quick Evaluation

## 2023-08-27 NOTE — Anesthesia Postprocedure Evaluation (Signed)
Anesthesia Post Note  Patient: Bonnie Warren  Procedure(s) Performed: Lumbar four-five Minimally Invasive Laminotomy, MICRODISCECTOMY (Right: Spine Lumbar)     Patient location during evaluation: PACU Anesthesia Type: General Level of consciousness: awake and alert Pain management: pain level controlled Vital Signs Assessment: post-procedure vital signs reviewed and stable Respiratory status: spontaneous breathing, nonlabored ventilation, respiratory function stable and patient connected to nasal cannula oxygen Cardiovascular status: blood pressure returned to baseline and stable Postop Assessment: no apparent nausea or vomiting Anesthetic complications: no   No notable events documented.  Last Vitals:  Vitals:   08/27/23 1500 08/27/23 1526  BP: 118/89 126/78  Pulse: 71 64  Resp: 14 16  Temp:  36.7 C  SpO2: 99% 100%    Last Pain:  Vitals:   08/27/23 1526  TempSrc: Oral  PainSc:                  [name] [name]

## 2023-08-27 NOTE — Evaluation (Addendum)
Occupational Therapy Evaluation Patient Details Name: Bonnie Warren MRN: [id number] DOB: [birth year] Today's Date: 08/27/2023   History of Present Illness 57 yo F s/p Lumbar four-five Minimally Invasive Laminotomy, MICRODISCECTOMY.  PMH includes: Anxiety Arthritis BackDepression Hypertension Legally blind Panic attacks Pneumonia.   Clinical Impression   Patient admitted for the procedure above.  Patient remains very independent despite central vision loss.  Expresses minimal pain, and is moving very well, patient is essentially at her baseline for ADL and in room mobility/toileting.  No OT needs exist in the acute setting, but OT will check back to ensure no questions and that she continues to perform at an Ind level.  PT consult is pending.  No post acute OT is anticipated.         If plan is discharge home, recommend the following: Assist for transportation    Functional Status Assessment  Patient has not had a recent decline in their functional status  Equipment Recommendations  None recommended by OT    Recommendations for Other Services       Precautions / Restrictions Precautions Precautions: Back Precaution Booklet Issued: Yes (comment) Restrictions Weight Bearing Restrictions: No      Mobility Bed Mobility Overal bed mobility: Modified Independent                  Transfers Overall transfer level: Independent                 General transfer comment: Able to walk in room and the halls without assist or AD.      Balance Overall balance assessment: No apparent balance deficits (not formally assessed)                                         ADL either performed or assessed with clinical judgement   ADL Overall ADL's : At baseline                                             Vision Baseline Vision/History: 2 Legally blind Patient Visual Report: No change from baseline       Perception Perception:  Not tested       Praxis Praxis: Not tested       Pertinent Vitals/Pain Pain Assessment Pain Assessment: Faces Faces Pain Scale: Hurts a little bit Pain Location: Incisional Pain Descriptors / Indicators: Sore Pain Intervention(s): Monitored during session     Extremity/Trunk Assessment Upper Extremity Assessment Upper Extremity Assessment: Overall WFL for tasks assessed   Lower Extremity Assessment Lower Extremity Assessment: Defer to PT evaluation   Cervical / Trunk Assessment Cervical / Trunk Assessment: Back Surgery   Communication Communication Communication: No apparent difficulties   Cognition Arousal: Alert Behavior During Therapy: WFL for tasks assessed/performed Overall Cognitive Status: Within Functional Limits for tasks assessed                                       General Comments   VSS on RA    Exercises     Shoulder Instructions      Home Living Family/patient expects to be discharged to:: Private residence Living Arrangements: Alone Available Help at Discharge: Friend(s);Available PRN/intermittently Type of  Home: House Home Access: Stairs to enter [employer] of Steps: 4 Entrance Stairs-Rails: Right;Left;Can reach both Home Layout: One level     Bathroom Shower/Tub: Producer, television/film/video: Standard Bathroom Accessibility: Yes How Accessible: Accessible via walker Home Equipment: Shower seat;Grab bars - tub/shower          Prior Functioning/Environment Prior Level of Function : Independent/Modified Independent             Mobility Comments: Able to use peripheral vision for mobility.  No AD at baseline ADLs Comments: Ind with ADL and iADL.  No longer drives, on disability.        OT Problem List: Pain      OT Treatment/Interventions:      OT Goals(Current goals can be found in the care plan section) Acute Rehab OT Goals Patient Stated Goal: Return home tomorrow OT Goal Formulation:  With patient Time For Goal Achievement: 08/30/23 Potential to Achieve Goals: Good  OT Frequency:      Co-evaluation              AM-PAC OT "6 Clicks" Daily Activity     Outcome Measure Help from another person eating meals?: None Help from another person taking care of personal grooming?: None Help from another person toileting, which includes using toliet, bedpan, or urinal?: None Help from another person bathing (including washing, rinsing, drying)?: None Help from another person to put on and taking off regular upper body clothing?: None Help from another person to put on and taking off regular lower body clothing?: None 6 Click Score: 24   End of Session Equipment Utilized During Treatment: Gait belt Nurse Communication: Mobility status  Activity Tolerance: Patient tolerated treatment well Patient left: in chair;with call bell/phone within reach  OT Visit Diagnosis: Pain Pain - Right/Left:  (Back)                Time: 4098-1191 OT Time Calculation (min): 25 min Charges:  OT General Charges $OT Visit: 1 Visit OT Evaluation $OT Eval Moderate Complexity: 1 Mod OT Treatments $Self Care/Home Management : 8-22 mins  08/27/2023  RP, OTR/L  Acute Rehabilitation Services  Office:  [phone]   Suzanna Obey 08/27/2023, 4:31 PM

## 2023-08-28 ENCOUNTER — Encounter (HOSPITAL_COMMUNITY): Payer: Self-pay | Admitting: Neurological Surgery

## 2023-08-28 DIAGNOSIS — M5116 Intervertebral disc disorders with radiculopathy, lumbar region: Secondary | ICD-10-CM | POA: Diagnosis not present

## 2023-08-28 NOTE — TOC Transition Note (Incomplete)
Transition of Care [facility]) - CM/SW Discharge Note   Patient Details  Name: Bonnie Warren MRN: [id number] Date of Birth: [birth year]  Transition of Care [facility]) CM/SW Contact:  Epifanio Lesches, RN Phone Number: 08/28/2023, 10:18 AM   Clinical Narrative:    Patient will DC to: home  Anticipated DC date:  08/28/2023 Family notified: yes Transport by: car     -s/p Lumbar Laminotomy, microdiscectomy         Per MD patient ready for DC today. RN, patient, and  patient's friend notified of DC.  Post hospital f/u noted on AVS. No recommended home health services from therapy. Referral made with [employer] for RW. Equipment will be delivered prior to d/c. Pt without RX meds concerns. TOC pharmacy filling RX meds prior to d/c for pickup. Friend to provide transportation to home,  [facility] will sign off for now as intervention is no longer needed. Please consult [place] again if new needs arise.   Final next level of care: Home/Self Care Barriers to Discharge: No Barriers Identified   Patient Goals and CMS Choice   Choice offered to / list presented to : Patient  Discharge Placement                         Discharge Plan and Services Additional resources added to the After Visit Summary for                  DME Arranged: Walker rolling DME Agency: [employer] Date DME Agency Contacted: 08/28/23 Time DME Agency Contacted: 1017 Representative spoke with at DME Agency: Ian Malkin            Social Determinants of Health (SDOH) Interventions SDOH Screenings   Food Insecurity: No Food Insecurity (08/27/2023)  Housing: Low Risk  (08/27/2023)  Transportation Needs: Unmet Transportation Needs (08/27/2023)  Utilities: Not At Risk (08/27/2023)  Alcohol Screen: Low Risk  (07/23/2022)  Tobacco Use: Low Risk  (08/27/2023)  Health Literacy: Low Risk  (07/11/2022)   Received from [facility]     Readmission Risk Interventions     No data to display

## 2023-08-28 NOTE — Discharge Summary (Signed)
Patient ID: Bonnie Warren MRN: [id number] DOB/AGE: 1966/02/22 57 y.o.  Admit date: 08/27/2023 Discharge date: 08/28/2023  Admission Diagnoses: Lumbar radiculopathy, chronic [M54.16]   Discharge Diagnoses: Same   Discharged Condition: Stable  Hospital Course:  Bonnie Warren is a 57 y.o. female who was admitted following an uncomplicated R L4-5 microdiscectomy. They were recovered in PACU and transferred to 5N. Hospital course was uncomplicated. Pt stable for discharge today. Pt to f/u in office for routine post op visit. Pt is in agreement w/ plan.    Discharge Exam: Blood pressure 101/65, pulse 67, temperature 98.4 F (36.9 C), temperature source Oral, resp. rate 18, height 5\' 3"  (1.6 m), weight 88.5 kg, SpO2 98%. A&O x3 Speech fluent, appropriate Strength 5/5 x4.  SILTx4.  Dressing c/d/I.   Disposition:   Discharge Instructions     Incentive spirometry RT   Complete by: As directed       Allergies as of 08/28/2023   No Known Allergies      Medication List     TAKE these medications    albuterol 108 (90 Base) MCG/ACT inhaler Commonly known as: VENTOLIN HFA Inhale 2 puffs into the lungs every 4 (four) hours as needed for wheezing or shortness of breath.   ALPRAZolam 0.5 MG tablet Commonly known as: XANAX Take 0.5 mg by mouth at bedtime as needed for anxiety.   B-12 PO Take 1 tablet by mouth daily.   diazepam 5 MG tablet Commonly known as: VALIUM Take 5 mg by mouth every 6 (six) hours as needed for anxiety.   fluticasone 50 MCG/ACT nasal spray Commonly known as: FLONASE Place 1 spray into both nostrils daily for 14 days.   gemfibrozil 600 MG tablet Commonly known as: LOPID Take 600 mg by mouth daily.   hydrochlorothiazide 12.5 MG tablet Commonly known as: HYDRODIURIL Take 12.5 mg by mouth daily.   HYDROcodone-acetaminophen 5-325 MG tablet Commonly known as: NORCO/VICODIN Take 1 tablet by mouth every 4 (four) hours as needed for  severe pain (pain score 7-10) or moderate pain (pain score 4-6).   lisinopril 20 MG tablet Commonly known as: ZESTRIL Take 20 mg by mouth daily.   methocarbamol 750 MG tablet Commonly known as: Robaxin-750 Take 1 tablet (750 mg total) by mouth every 6 (six) hours as needed for muscle spasms.   rosuvastatin 10 MG tablet Commonly known as: CRESTOR Take 10 mg by mouth at bedtime.   SYSTANE OP Place 1 drop into both eyes as needed (dry eyes).   Vitamin D (Ergocalciferol) 1.25 MG (50000 UNIT) Caps capsule Commonly known as: DRISDOL Take 50,000 Units by mouth once a week.               Durable Medical Equipment  (From admission, onward)           Start     Ordered   08/28/23 1001  For home use only DME Walker rolling  Once       Question Answer Comment  Walker: With 5 Inch Wheels   Patient needs a walker to treat with the following condition Gait abnormality      08/28/23 1001            Follow-up Information     Vyas, Dhruv B, MD Follow up.   Specialty: Internal Medicine Contact information: [address] [place] [address] [phone]         [facility] Follow up.   Specialty: Neurosurgery Contact information:  [address] [place] [address] [phone]                 Signed: Clovis Riley 08/28/2023, 10:25 AM

## 2023-08-28 NOTE — Plan of Care (Signed)

## 2023-08-28 NOTE — Progress Notes (Signed)
Discharge instructions reviewed with pt and her family/friend.  Copy of instructions given to pt. Bonnie Warren TOC Pharmacy filled scripts and will be picked up on the way out for discharge. Pt's ride is at the bedside.  Pt d/c'd via wheelchair with belongings, with family/friend.           Escorted by staff.   Atiyah Bauer,RN SWOT

## 2023-08-28 NOTE — Evaluation (Signed)
Physical Therapy Evaluation Patient Details Name: Bonnie Warren MRN: [id number] DOB: 1966/07/15 Today's Date: 08/28/2023  History of Present Illness  57 yo F s/p Lumbar four-five Minimally Invasive Laminotomy, MICRODISCECTOMY.  PMH includes: Anxiety Arthritis BackDepression Hypertension Legally blind Panic attacks Pneumonia.   Clinical Impression  Pt in bed upon arrival and agreeable to PT eval. Prior to admit, pt was independent with all mobility with no AD. Pt is close to functional mobility baseline with being ModI for all mobility. Pt is able to ambulate in today's session with no AD, however, pt prefers RW for increased stability and to accommodate for vision loss. Pt was able to ascend/descend 5 steps with supervision demonstrating safety returning home. Pt was able to verbalize and demonstrate spinal precautions with no further questions. Pt has no acute PT needs. Will follow up with physician recommendations for post acute PT services. Acute PT signing off.          If plan is discharge home, recommend the following: A little help with walking and/or transfers;Help with stairs or ramp for entrance   Can travel by private vehicle    Yes    Equipment Recommendations Rolling walker (2 wheels)     Functional Status Assessment Patient has had a recent decline in their functional status and demonstrates the ability to make significant improvements in function in a reasonable and predictable amount of time.     Precautions / Restrictions Precautions Precautions: Back Precaution Booklet Issued: Yes (comment) Restrictions Weight Bearing Restrictions: No      Mobility  Bed Mobility Overal bed mobility: Modified Independent    General bed mobility comments: used rails    Transfers Overall transfer level: Independent Equipment used: None   Ambulation/Gait Ambulation/Gait assistance: Modified independent (Device/Increase time) Gait Distance (Feet): 400 Feet Assistive  device: Rolling walker (2 wheels), None Gait Pattern/deviations: Step-through pattern, Decreased stride length Gait velocity: dec     General Gait Details: slow and steady gait with shortened steps. Pt able to ambulate w/o AD, however, preferred RW for comfort  Stairs Stairs: Yes Stairs assistance: Supervision Stair Management: Two rails, One rail Left, Alternating pattern, Step to pattern, Sideways Number of Stairs: 5 General stair comments: practiced step to pattern w/ B rails and sideways step pattern w/ 1 rail. Pt preferred sideways steps w/ 1 rail         Balance Overall balance assessment: No apparent balance deficits (not formally assessed)        Pertinent Vitals/Pain Pain Assessment Pain Assessment: Faces Faces Pain Scale: Hurts little more Pain Location: Incisional Pain Descriptors / Indicators: Sore Pain Intervention(s): Limited activity within patient's tolerance, Monitored during session, Repositioned    Home Living Family/patient expects to be discharged to:: Private residence Living Arrangements: Alone Available Help at Discharge: Friend(s);Available PRN/intermittently Type of Home: House Home Access: Stairs to enter Entrance Stairs-Rails: Right;Left;Can reach both Entrance Stairs-Number of Steps: 4   Home Layout: One level Home Equipment: Shower seat;Grab bars - tub/shower      Prior Function Prior Level of Function : Independent/Modified Independent        Mobility Comments: Able to use peripheral vision for mobility.  No AD at baseline ADLs Comments: Ind with ADL and iADL.  No longer drives, on disability.     Extremity/Trunk Assessment   Upper Extremity Assessment Upper Extremity Assessment: Defer to OT evaluation    Lower Extremity Assessment Lower Extremity Assessment: Overall WFL for tasks assessed    Cervical / Trunk Assessment Cervical /  Trunk Assessment: Back Surgery  Communication   Communication Communication: No apparent  difficulties  Cognition Arousal: Alert Behavior During Therapy: WFL for tasks assessed/performed Overall Cognitive Status: Within Functional Limits for tasks assessed       General Comments General comments (skin integrity, edema, etc.): VSS on RA     PT Assessment Patient does not need any further PT services   AM-PAC PT "6 Clicks" Mobility  Outcome Measure Help needed turning from your back to your side while in a flat bed without using bedrails?: None Help needed moving from lying on your back to sitting on the side of a flat bed without using bedrails?: None Help needed moving to and from a bed to a chair (including a wheelchair)?: None Help needed standing up from a chair using your arms (e.g., wheelchair or bedside chair)?: None Help needed to walk in hospital room?: None Help needed climbing 3-5 steps with a railing? : A Little 6 Click Score: 23    End of Session Equipment Utilized During Treatment: Gait belt Activity Tolerance: Patient tolerated treatment well Patient left: in chair;with call bell/phone within reach Nurse Communication: Mobility status PT Visit Diagnosis: Unsteadiness on feet (R26.81)    Time: 4098-1191 PT Time Calculation (min) (ACUTE ONLY): 21 min   Charges:   PT Evaluation $PT Eval Low Complexity: 1 Low   PT General Charges $$ ACUTE PT VISIT: 1 Visit         Hilton Cork, PT, DPT Secure Chat Preferred  Rehab Office [phone]   Arturo Morton Brion Aliment 08/28/2023, 10:12 AM

## 2023-10-14 IMAGING — MG MM DIGITAL SCREENING BILAT W/ TOMO AND CAD
8 series · 9 of 24 positions shown · non-contrast
Comparison: Previous exam(s).

CLINICAL DATA: Screening.

EXAM:
DIGITAL SCREENING BILATERAL MAMMOGRAM WITH TOMOSYNTHESIS AND CAD
TECHNIQUE: Bilateral screening digital craniocaudal and mediolateral oblique
mammograms were obtained. Bilateral screening digital breast
tomosynthesis was performed. The images were evaluated with
computer-aided detection.

[L CC synth-2D]
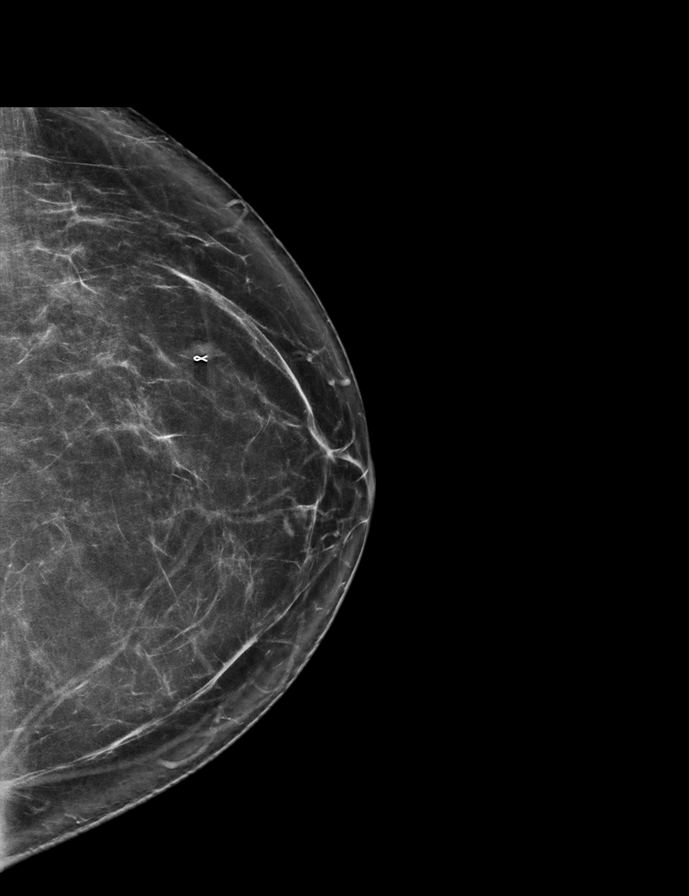

[R CC synth-2D]
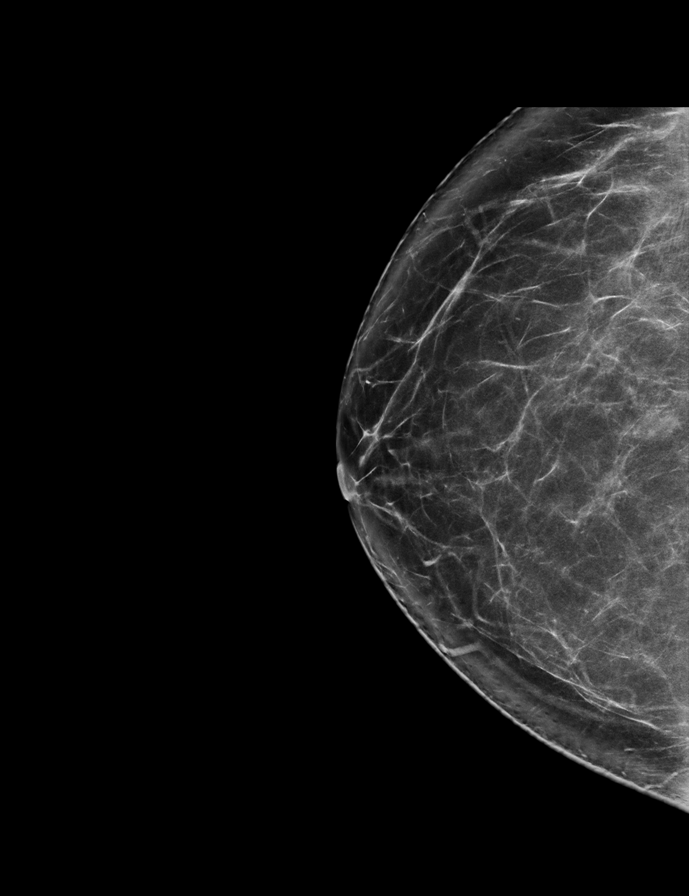

[L MLO synth-2D]
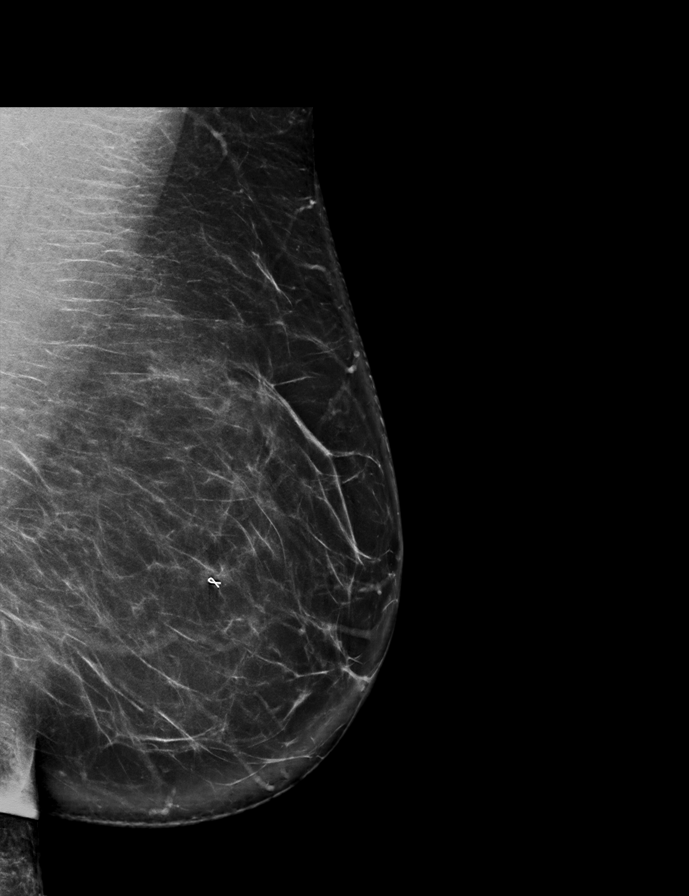

[R MLO synth-2D]
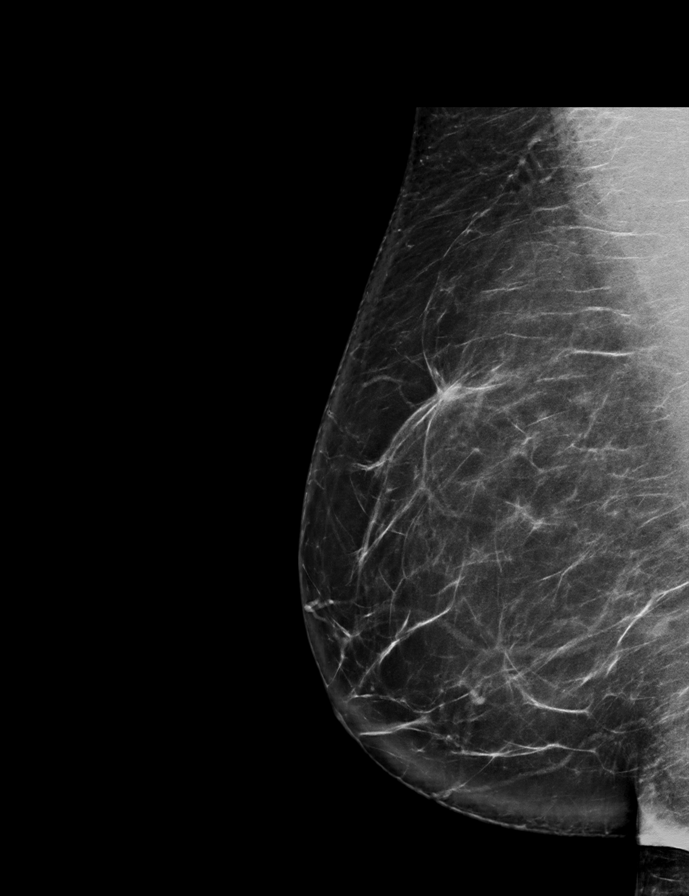

[R MLO tomo · 2 of 88 frames shown]
[frame 29/88]
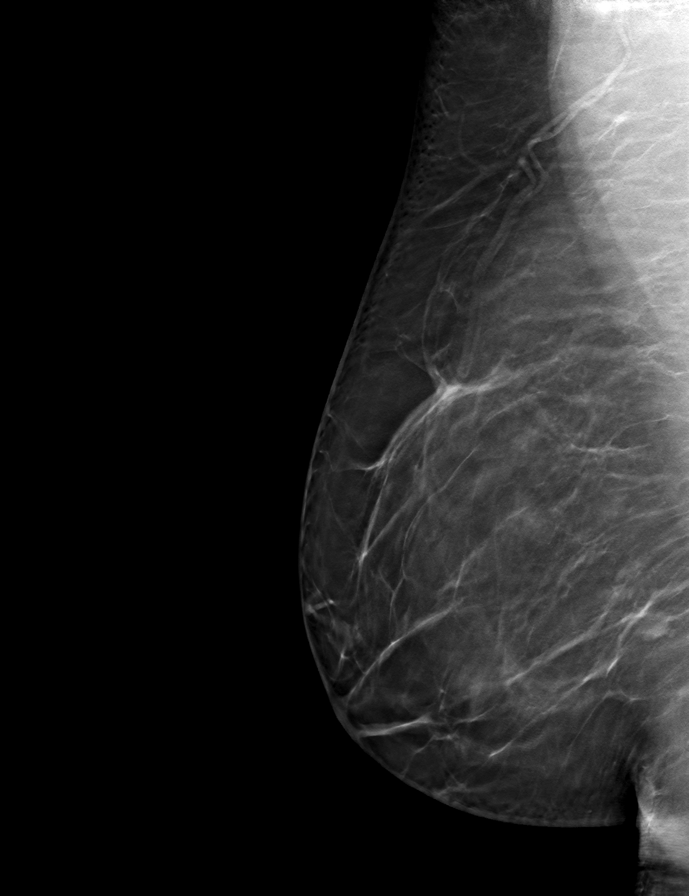
[frame 45/88]
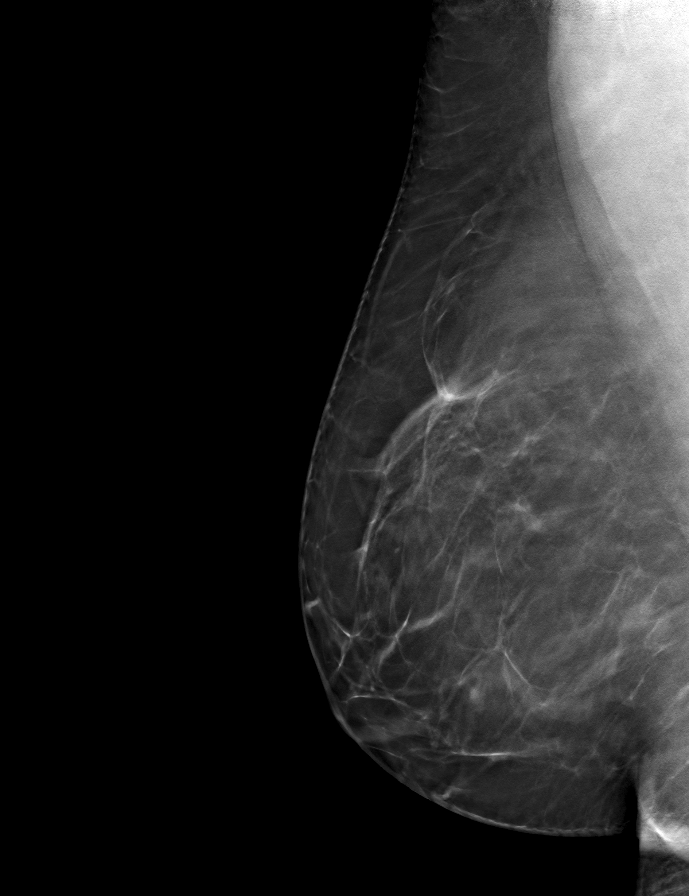

[R CC tomo · tomo slice 39/78.0]
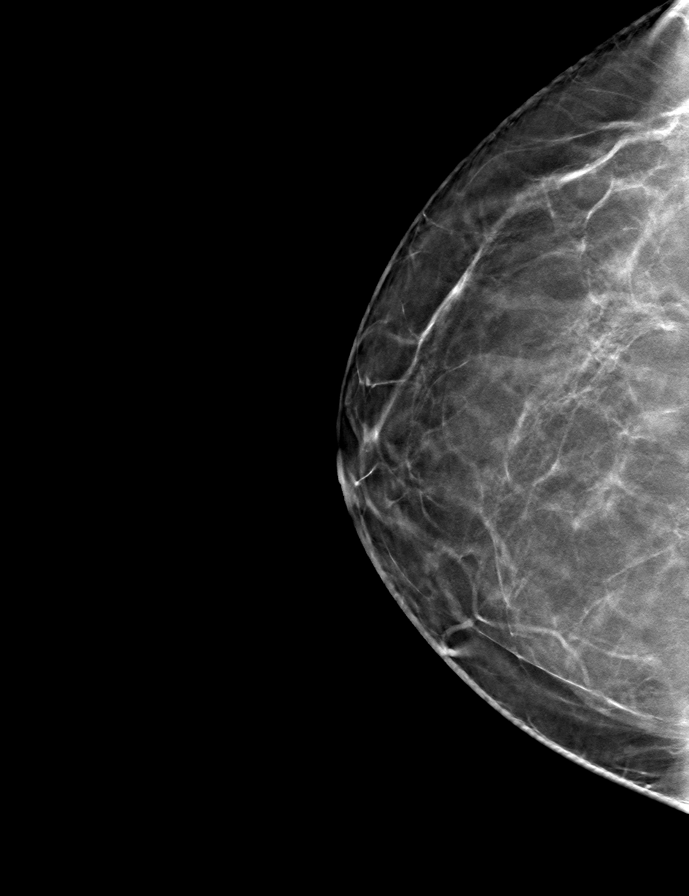

[L CC tomo · tomo slice 41/80.0]
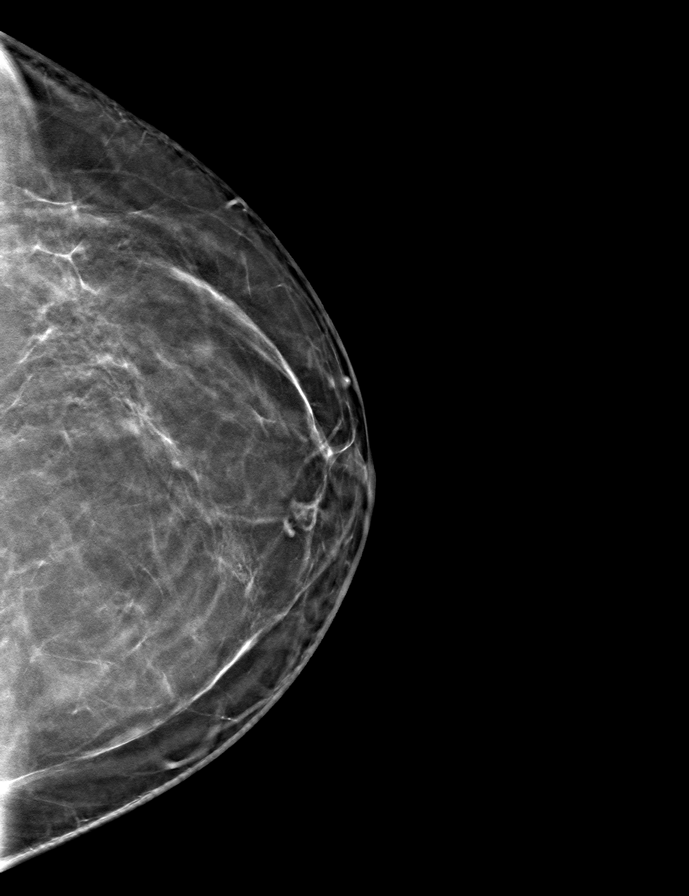

[L MLO tomo · tomo slice 46/91.0]
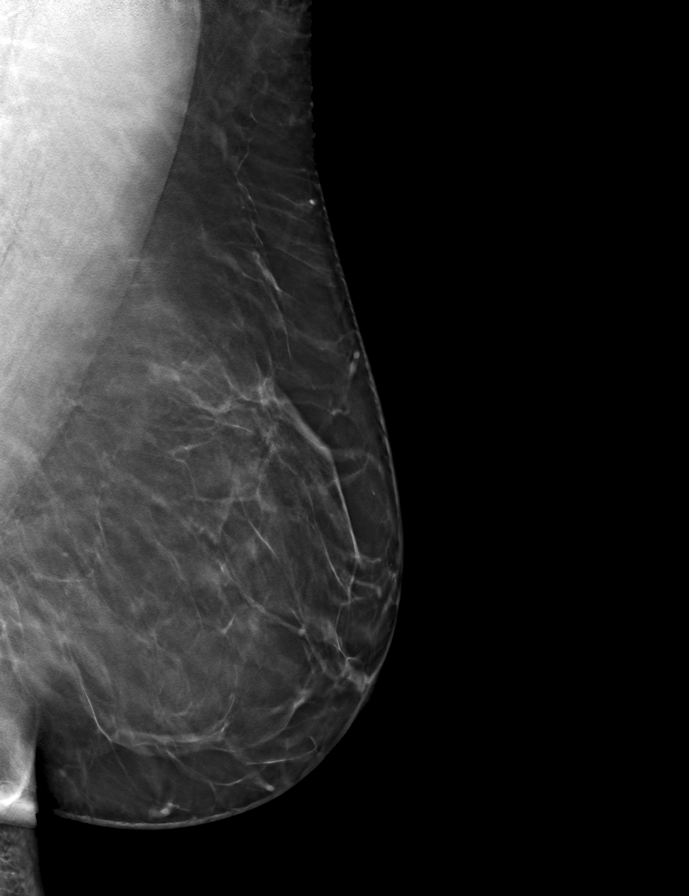

[9 of 24 positions shown; findings below may reference images not displayed]

ACR Breast Density Category b: There are scattered areas of
fibroglandular density.
FINDINGS: In the right breast, a possible mass warrants further evaluation. In
the left breast, no findings suspicious for malignancy.
IMPRESSION: Further evaluation is suggested for a possible mass in the right
breast.

RECOMMENDATION:
Diagnostic mammogram and possibly ultrasound of the right breast.
(Code:[id number])

The patient will be contacted regarding the findings, and additional
imaging will be scheduled.

BI-RADS CATEGORY  0: Incomplete. Need additional imaging evaluation
and/or prior mammograms for comparison.

## 2023-10-18 ENCOUNTER — Other Ambulatory Visit (HOSPITAL_COMMUNITY): Payer: Self-pay

## 2023-11-12 ENCOUNTER — Ambulatory Visit (HOSPITAL_COMMUNITY): Payer: Medicare Other

## 2023-11-26 ENCOUNTER — Inpatient Hospital Stay: Admission: RE | Admit: 2023-11-26 | Payer: Medicare Other | Source: Ambulatory Visit

## 2023-11-28 ENCOUNTER — Ambulatory Visit (HOSPITAL_COMMUNITY): Payer: Medicare Other

## 2023-12-09 ENCOUNTER — Other Ambulatory Visit: Payer: Self-pay

## 2023-12-24 ENCOUNTER — Ambulatory Visit
Admission: RE | Admit: 2023-12-24 | Discharge: 2023-12-24 | Disposition: A | Discharge status: Home / Self Care | Source: Ambulatory Visit | Attending: Internal Medicine | Admitting: Internal Medicine

## 2023-12-24 DIAGNOSIS — Z1231 Encounter for screening mammogram for malignant neoplasm of breast: Secondary | ICD-10-CM

## 2023-12-25 ENCOUNTER — Ambulatory Visit: Payer: Medicare Other | Admitting: Obstetrics & Gynecology

## 2023-12-25 ENCOUNTER — Ambulatory Visit
Admission: EM | Admit: 2023-12-25 | Discharge: 2023-12-25 | Disposition: A | Discharge status: Home / Self Care | Attending: Nurse Practitioner | Admitting: Nurse Practitioner

## 2023-12-25 DIAGNOSIS — R062 Wheezing: Secondary | ICD-10-CM | POA: Diagnosis not present

## 2023-12-25 DIAGNOSIS — J309 Allergic rhinitis, unspecified: Secondary | ICD-10-CM

## 2023-12-25 LAB — POC COVID19/FLU A&B COMBO
Covid Antigen, POC: NEGATIVE
Influenza A Antigen, POC: NEGATIVE
Influenza B Antigen, POC: NEGATIVE

## 2023-12-25 MED ORDER — MONTELUKAST SODIUM 10 MG PO TABS
10.0000 mg | ORAL_TABLET | Freq: Every day | ORAL | 0 refills | Status: AC
Start: 2023-12-25 — End: ?

## 2023-12-25 MED ORDER — PREDNISONE 20 MG PO TABS: 40.0000 mg | ORAL_TABLET | Freq: Every day | ORAL | 0 refills | Status: AC

## 2023-12-25 MED ORDER — ALBUTEROL SULFATE HFA 108 (90 BASE) MCG/ACT IN AERS: 2.0000 | INHALATION_SPRAY | Freq: Four times a day (QID) | RESPIRATORY_TRACT | 0 refills | Status: AC | PRN

## 2023-12-25 MED ORDER — PSEUDOEPH-BROMPHEN-DM 30-2-10 MG/5ML PO SYRP: 5.0000 mL | ORAL_SOLUTION | Freq: Four times a day (QID) | ORAL | 0 refills | Status: AC | PRN

## 2023-12-25 NOTE — Discharge Instructions (Addendum)
 The COVID/flu test was negative. Take medication as prescribed.  Continue your current allergy medication regimen. May take over-the-counter Tylenol or ibuprofen as needed for pain, fever, or general discomfort. Recommend normal saline nasal spray throughout the day for nasal congestion and runny nose. If your cough worsens, recommend use of a humidifier in your bedroom at nighttime during sleep and sleeping elevated on pillows while cough symptoms persist. As discussed, if you develop new symptoms such as fever, chills, or symptoms fail to improve, you may follow-up in this clinic or with your primary care physician for further evaluation. Follow-up as needed.

## 2023-12-25 NOTE — ED Provider Notes (Signed)
 [facility]    CSN: [id number] Arrival date & time: 12/25/23  0865      History   Chief Complaint No chief complaint on file.   HPI Bonnie Warren is a 58 y.o. female.   The history is provided by the patient.   Patient presents for complaints of cough, nasal congestion, runny nose, and fatigue.  Patient states symptoms started over the past week, with wheezing starting this morning when she woke up.  She denies fever, chills, headache, ear pain, difficulty breathing, chest pain, abdominal pain, nausea, vomiting, diarrhea, or rash.  Patient reports that she has an underlying history of seasonal allergies.  States that she takes an over-the-counter allergy medication and uses Flonase daily.  She denies any obvious known sick contacts. Past Medical History:  Diagnosis Date  . Anxiety   . 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  . Depression   . Hypertension   . Legally blind   . Panic attacks   . Pneumonia     Patient Active Problem List   Diagnosis Date Noted  . Lumbar radiculopathy, chronic 08/27/2023  . GAD (generalized anxiety disorder) 10/24/2017  . Moderate episode of recurrent major depressive disorder (HCC) 10/24/2017  . Panic disorder 10/24/2017    Past Surgical History:  Procedure Laterality Date  . COLONOSCOPY    . LUMBAR LAMINECTOMY/ DECOMPRESSION WITH MET-RX Right 08/27/2023   Procedure: Lumbar four-five Minimally Invasive Laminotomy, MICRODISCECTOMY;  Surgeon: Dawley, Alan Mulder, DO;  Location: [facility] OR;  Service: Neurosurgery;  Laterality: Right;  . TUBAL LIGATION  1991    OB History     Gravida  2   Para  2   Term  2   Preterm      AB      Living  2      SAB      IAB      Ectopic      Multiple      Live Births  2            Home Medications    Prior to Admission medications   Medication Sig Start Date End Date Taking? Authorizing Provider  albuterol (VENTOLIN HFA) 108 (90 Base) MCG/ACT inhaler Inhale 2 puffs into the lungs every  6 (six) hours as needed. 12/25/23  Yes Leath-Warren, Sadie Haber, NP  brompheniramine-pseudoephedrine-DM 30-2-10 MG/5ML syrup Take 5 mLs by mouth 4 (four) times daily as needed. 12/25/23  Yes Leath-Warren, Sadie Haber, NP  montelukast (SINGULAIR) 10 MG tablet Take 1 tablet (10 mg total) by mouth at bedtime. 12/25/23  Yes Leath-Warren, Sadie Haber, NP  predniSONE (DELTASONE) 20 MG tablet Take 2 tablets (40 mg total) by mouth daily with breakfast for 5 days. 12/25/23 12/30/23 Yes Leath-Warren, Sadie Haber, NP  ALPRAZolam Prudy Feeler) 0.5 MG tablet Take 0.5 mg by mouth at bedtime as needed for anxiety.    [provider]  Cyanocobalamin (B-12 PO) Take 1 tablet by mouth daily.    [provider]  diazepam (VALIUM) 5 MG tablet Take 5 mg by mouth every 6 (six) hours as needed for anxiety. 03/06/22   [provider]  fluticasone (FLONASE) 50 MCG/ACT nasal spray Place 1 spray into both nostrils daily for 14 days. Patient not taking: Reported on 08/14/2023 07/11/20 01/21/23  Durward Parcel, FNP  gemfibrozil (LOPID) 600 MG tablet Take 600 mg by mouth daily. 07/11/23   [provider]  hydrochlorothiazide (HYDRODIURIL) 12.5 MG tablet Take 12.5 mg by mouth daily. 07/03/22  [provider]  HYDROcodone-acetaminophen (NORCO/VICODIN) 5-325 MG tablet Take 1 tablet by mouth every 4 (four) hours as needed for severe pain (pain score 7-10) or moderate pain (pain score 4-6). 08/27/23 08/26/24  Patrici Ranks Caylin, PA-C  lisinopril (ZESTRIL) 20 MG tablet Take 20 mg by mouth daily. 07/03/22   [provider]  methocarbamol (ROBAXIN-750) 750 MG tablet Take 1 tablet (750 mg total) by mouth every 6 (six) hours as needed for muscle spasms. 08/27/23   Clovis Riley, PA-C  Polyethyl Glycol-Propyl Glycol (SYSTANE OP) Place 1 drop into both eyes as needed (dry eyes).    [provider]  rosuvastatin (CRESTOR) 10 MG tablet Take 10 mg by mouth at bedtime. 05/09/21   [provider]  Vitamin D, Ergocalciferol, (DRISDOL) 1.25 MG (50000 UNIT) CAPS capsule Take 50,000 Units by mouth once a week. 07/30/23   [provider]  sertraline (ZOLOFT) 100 MG tablet Take 2 tablets (200 mg total) by mouth daily. 10/22/18 05/09/20  Neysa Hotter, MD    Family History Family History  Problem Relation Age of Onset  . Depression Maternal Grandmother   . Alcohol abuse Maternal Grandfather   . Hypertension Mother   . Breast cancer Neg Hx     Social History Social History   Tobacco Use  . Smoking status: Never  . Smokeless tobacco: Never  Vaping Use  . Vaping status: Never Used  Substance Use Topics  . Alcohol use: Yes    Comment: occasional, social, every 3 months  . Drug use: No     Allergies   Patient has no known allergies.   Review of Systems Review of Systems Per HPI  Physical Exam Triage Vital Signs ED Triage Vitals  Encounter Vitals Group     BP 12/25/23 0923 120/81     Systolic BP Percentile --      Diastolic BP Percentile --      Pulse Rate 12/25/23 0923 78     Resp 12/25/23 0923 18     Temp 12/25/23 0923 98.9 F (37.2 C)     Temp Source 12/25/23 0923 Oral     SpO2 12/25/23 0923 97 %     Weight --      Height --      Head Circumference --      Peak Flow --      Pain Score 12/25/23 0925 0     Pain Loc --      Pain Education --      Exclude from Growth Chart --    No data found.  Updated Vital Signs BP 120/81 (BP Location: Right Arm)   Pulse 78   Temp 98.9 F (37.2 C) (Oral)   Resp 18   SpO2 97%   Visual Acuity Right Eye Distance:   Left Eye Distance:   Bilateral Distance:    Right Eye Near:   Left Eye Near:    Bilateral Near:     Physical Exam Vitals and nursing note reviewed.  Constitutional:      General: She is not in acute distress.    Appearance: Normal appearance.  HENT:     Head: Normocephalic.     Right Ear: Tympanic membrane, ear canal and external ear normal.     Left Ear: Tympanic membrane,  ear canal and external ear normal.     Nose: Congestion present.     Right Turbinates: Enlarged and swollen.     Left Turbinates: Enlarged and swollen.  Right Sinus: No maxillary sinus tenderness or frontal sinus tenderness.     Left Sinus: No maxillary sinus tenderness or frontal sinus tenderness.     Mouth/Throat:     Lips: Pink.     Mouth: Mucous membranes are moist.     Pharynx: Postnasal drip present. No pharyngeal swelling, oropharyngeal exudate, posterior oropharyngeal erythema or uvula swelling.     Comments: Cobblestoning present to posterior oropharynx  Eyes:     Extraocular Movements: Extraocular movements intact.     Conjunctiva/sclera: Conjunctivae normal.     Pupils: Pupils are equal, round, and reactive to light.  Cardiovascular:     Rate and Rhythm: Normal rate and regular rhythm.     Pulses: Normal pulses.     Heart sounds: Normal heart sounds.  Pulmonary:     Effort: Pulmonary effort is normal. No respiratory distress.     Breath sounds: Normal breath sounds. No stridor. No wheezing, rhonchi or rales.  Abdominal:     General: Bowel sounds are normal.     Palpations: Abdomen is soft.     Tenderness: There is no abdominal tenderness.  Musculoskeletal:     Cervical back: Normal range of motion.  Skin:    General: Skin is warm and dry.  Neurological:     General: No focal deficit present.     Mental Status: She is alert and oriented to person, place, and time.  Psychiatric:        Mood and Affect: Mood normal.        Behavior: Behavior normal.     UC Treatments / Results  Labs (all labs ordered are listed, but only abnormal results are displayed) Labs Reviewed  POC COVID19/FLU A&B COMBO - Normal    EKG   Radiology No results found.  Procedures Procedures (including critical care time)  Medications Ordered in UC Medications - No data to display  Initial Impression / Assessment and Plan / UC Course  I have reviewed the triage vital signs and  the nursing notes.  Pertinent labs & imaging results that were available during my care of the patient were reviewed by me and considered in my medical decision making (see chart for details).  COVID/flu test was negative.  Symptoms are consistent with allergic rhinitis.  Will provide symptomatic treatment with Bromfed-DM for cough, Singulair 10 mg for allergies, and prednisone 40 mg for wheezing.  Patient was also provided an inhaler for ongoing wheezing.  Supportive care recommendations were provided and discussed with the patient to include fluids, rest, over-the-counter analgesics, normal saline nasal spray, and use of a humidifier during sleep.  Discussed indications regarding follow-up.  Patient was in agreement with this plan of care and verbalizes understanding.  All questions were answered.  Patient stable for discharge.  Final Clinical Impressions(s) / UC Diagnoses   Final diagnoses:  Allergic rhinitis, unspecified seasonality, unspecified trigger  Wheezing     Discharge Instructions      The COVID/flu test was negative. Take medication as prescribed.  Continue your current allergy medication regimen. May take over-the-counter Tylenol or ibuprofen as needed for pain, fever, or general discomfort. Recommend normal saline nasal spray throughout the day for nasal congestion and runny nose. If your cough worsens, recommend use of a humidifier in your bedroom at nighttime during sleep and sleeping elevated on pillows while cough symptoms persist. As discussed, if you develop new symptoms such as fever, chills, or symptoms fail to improve, you may follow-up in this clinic or with  your primary care physician for further evaluation. Follow-up as needed.     ED Prescriptions     Medication Sig Dispense Auth. Provider   predniSONE (DELTASONE) 20 MG tablet Take 2 tablets (40 mg total) by mouth daily with breakfast for 5 days. 10 tablet Leath-Warren, Sadie Haber, NP    brompheniramine-pseudoephedrine-DM 30-2-10 MG/5ML syrup Take 5 mLs by mouth 4 (four) times daily as needed. 140 mL Leath-Warren, Sadie Haber, NP   montelukast (SINGULAIR) 10 MG tablet Take 1 tablet (10 mg total) by mouth at bedtime. 30 tablet Leath-Warren, Sadie Haber, NP   albuterol (VENTOLIN HFA) 108 (90 Base) MCG/ACT inhaler Inhale 2 puffs into the lungs every 6 (six) hours as needed. 8 g Leath-Warren, Sadie Haber, NP      PDMP not reviewed this encounter.   Abran Cantor, NP 12/25/23 1226

## 2023-12-25 NOTE — ED Triage Notes (Signed)
 Pt reports cough, congestion, runny nose, fatigued, states she woke up this morning wheezing.

## 2023-12-27 ENCOUNTER — Other Ambulatory Visit: Payer: Self-pay | Admitting: Internal Medicine

## 2023-12-27 DIAGNOSIS — R928 Other abnormal and inconclusive findings on diagnostic imaging of breast: Secondary | ICD-10-CM

## 2024-01-02 ENCOUNTER — Ambulatory Visit (HOSPITAL_COMMUNITY)

## 2024-01-06 ENCOUNTER — Other Ambulatory Visit

## 2024-01-06 ENCOUNTER — Encounter

## 2024-01-15 NOTE — Therapy (Signed)
 OUTPATIENT PHYSICAL THERAPY THORACOLUMBAR EVALUATION   Patient Name: Bonnie Warren MRN: [id number] DOB:04-04-1966, 58 y.o., female Today's Date: 01/16/2024  END OF SESSION:  PT End of Session - 01/16/24 1342     Visit Number 1    Number of Visits 6    Date for PT Re-Evaluation 02/28/24    Authorization Type [employer]    Authorization Time Period please check auth    Progress Note Due on Visit 10    PT Start Time 1345    PT Stop Time 1425    PT Time Calculation (min) 40 min    Activity Tolerance Patient tolerated treatment well    Behavior During Therapy [facility] for tasks assessed/performed             Past Medical History:  Diagnosis Date   Anxiety    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   Depression    Hypertension    Legally blind    Panic attacks    Pneumonia    Past Surgical History:  Procedure Laterality Date   COLONOSCOPY     LUMBAR LAMINECTOMY/ DECOMPRESSION WITH MET-RX Right 08/27/2023   Procedure: Lumbar four-five Minimally Invasive Laminotomy, MICRODISCECTOMY;  Surgeon: Pincus Bridgeman, DO;  Location: [facility] OR;  Service: Neurosurgery;  Laterality: Right;   TUBAL LIGATION  1991   Patient Active Problem List   Diagnosis Date Noted   Lumbar radiculopathy, chronic 08/27/2023   GAD (generalized anxiety disorder) 10/24/2017   Moderate episode of recurrent major depressive disorder (HCC) 10/24/2017   Panic disorder 10/24/2017    PCP: Orlena Bitters, MD  REFERRING PROVIDER: Dawley, Colby Daub, DO  REFERRING DIAG: M54.16 (ICD-10-CM) - Lumbar radiculopathy  Rationale for Evaluation and Treatment: Rehabilitation  THERAPY DIAG:  Radiculopathy, lumbar region - Plan: PT plan of care cert/re-cert  Low back pain, unspecified back pain laterality, unspecified chronicity, unspecified whether sciatica present - Plan: PT plan of care cert/re-cert  ONSET DATE: s/p lumbar laminectomy 08/26/24  SUBJECTIVE:                                                                                                                                                                                            SUBJECTIVE STATEMENT: Sciatica down right leg; back trouble since she fell at [employer] 3 years ago.  Her PCP ordered an MRI showed a bulging disc referred to [facility]; had surgery 08/26/24 with expectation to alleviate leg pain.  Her leg pain is gone but she is still having back pain into bilateral low back; glutes.  Dawley ordered therapy for her; she has had to postpone her therapy a few times  due to her ride falling through  PERTINENT HISTORY:  Legally blind  PAIN:  Are you having pain? Yes: NPRS scale: 4/10 Pain location: low back both sides out to hips Pain description: radiating out Aggravating factors: standing, walking, lifting Relieving factors: rest,   PRECAUTIONS: None  RED FLAGS: None   WEIGHT BEARING RESTRICTIONS: No  FALLS:  Has patient fallen in last 6 months? No  LIVING ENVIRONMENT: Lives with: lives alone Lives in: House/apartment Stairs: Yes: External: 5 steps; on right going up, on left going up, and bilateral but cannot reach both Has following equipment at home:  none  OCCUPATION: on disability since 2019  PLOF: Independent  PATIENT GOALS:  get my back stronger; get back into exercise  NEXT MD VISIT: PRN  OBJECTIVE:  Note: Objective measures were completed at Evaluation unless otherwise noted.  DIAGNOSTIC FINDINGS:  CLINICAL DATA:  Fall 2 years ago. Low back pain radiating to the right leg and ankle.   EXAM: MRI LUMBAR SPINE WITHOUT CONTRAST   TECHNIQUE: Multiplanar, multisequence MR imaging of the lumbar spine was performed. No intravenous contrast was administered.   COMPARISON:  CT lumbar spine 06/01/2021   FINDINGS: Segmentation: Standard; the lowest formed disc space is designated L5-S1.   Alignment:  Normal.   Vertebrae: Vertebral body heights are preserved. Background marrow signal is normal. There is a probable benign  intraosseous hemangioma in the T12 vertebral body. Scattered small endplate Schmorl's nodes are noted. There is no suspicious marrow signal abnormality or marrow edema.   Conus medullaris and cauda equina: Conus extends to the L1-L2 level. Conus and cauda equina appear normal.   Paraspinal and other soft tissues: A 1.6 cm left renal lesion is unchanged, characterized on prior MR abdomen from 2022. The paraspinal soft tissues are unremarkable.   Disc levels:   There is mild disc desiccation and narrowing at L3-L4 through L5-S1.   T12-L1: There is a tiny central protrusion without significant spinal canal or neural foraminal stenosis.   L1-L2: Mild facet arthropathy without significant spinal canal or neural foraminal stenosis.   L2-L3: Mild facet arthropathy without significant spinal canal or neural foraminal stenosis.   L3-L4: There is a mild disc bulge and mild facet arthropathy without significant spinal canal or neural foraminal stenosis.   L4-L5: There is a mild disc bulge with a superimposed inferiorly migrated right subarticular zone extrusion and moderate facet arthropathy resulting in impingement of the traversing right L5 nerve root in the subarticular zone, and mild left and no significant right neural foraminal stenosis.   L5-S1: No significant spinal canal or neural foraminal stenosis.   IMPRESSION: 1. Inferiorly migrated right subarticular zone disc extrusion and moderate facet arthropathy at L4-L5 resulting in impingement of the traversing right L5 nerve root. Correlate with radicular symptoms. 2. Otherwise, mild degenerative changes as above without other evidence of nerve root impingement. 3. Left renal cysts better characterized on prior MRI abdomen from 2022. See follow up recommendations on that study.     CLINICAL DATA:  L4-5 MIS laminectomy   EXAM: LUMBAR SPINE - 1 VIEW   COMPARISON:  Lumbar MRI 07/03/2023.   FINDINGS: C-arm fluoroscopy was  provided in the operating room without the presence of a radiologist.7.2 seconds fluoroscopy time. 6.24 mGy air kerma.   Two C-arm fluoroscopic images were obtained intraoperatively and are submitted for post operative interpretation. Submitted images demonstrate posterior localization of the L4-disc space and placement of a Metrx tube.   Please see intraoperative findings for further  detail.   IMPRESSION: Intraoperative fluoroscopic guidance for L4-5 laminectomy.      PATIENT SURVEYS:  Modified Oswestry 19/50 38%   COGNITION: Overall cognitive status: Within functional limits for tasks assessed     SENSATION: WFL   POSTURE: rounded shoulders and forward head  PALPATION: Tender right side PSIS and lumbar multifidus  LUMBAR ROM:   AROM eval  Flexion To ankles  Extension 25%  Right lateral flexion *3" above knee joint line  Left lateral flexion To knee joint line   Right rotation   Left rotation    (Blank rows = not tested)  LOWER EXTREMITY ROM:     Active  Right eval Left eval  Hip flexion    Hip extension    Hip abduction    Hip adduction    Hip internal rotation    Hip external rotation    Knee flexion    Knee extension    Ankle dorsiflexion    Ankle plantarflexion    Ankle inversion    Ankle eversion     (Blank rows = not tested)  LOWER EXTREMITY MMT:    MMT Right eval Left eval  Hip flexion 4+ 4+  Hip extension 4 4+  Hip abduction    Hip adduction    Hip internal rotation    Hip external rotation    Knee flexion 4 4+  Knee extension 4+ 5  Ankle dorsiflexion 5 5  Ankle plantarflexion    Ankle inversion    Ankle eversion     (Blank rows = not tested)   FUNCTIONAL TESTS:  5 times sit to stand: 9.23 sec using hands to assist  GAIT: Distance walked: 50 ft in clinic  Assistive device utilized: None Level of assistance: Modified independence Comments: no AD  TREATMENT DATE: 01/16/24 physical therapy evaluation and HEP instruction                                                                                                                                  PATIENT EDUCATION:  Education details: Patient educated on exam findings, POC, scope of PT, HEP, and what to expect next visit. Person educated: Patient Education method: Explanation, Demonstration, and Handouts Education comprehension: verbalized understanding, returned demonstration, verbal cues required, and tactile cues required  HOME EXERCISE PROGRAM: Decompression exercises 2-5  ASSESSMENT:  CLINICAL IMPRESSION: Patient is a 58 y.o. female who was seen today for physical therapy evaluation and treatment for M54.16 (ICD-10-CM) - Lumbar radiculopathy. Patient demonstrates muscle weakness, reduced ROM, and fascial restrictions which are likely contributing to symptoms of pain and are negatively impacting patient ability to perform ADLs and functional mobility tasks. Patient will benefit from skilled physical therapy services to address these deficits to reduce pain and improve level of function with ADLs and functional mobility tasks.   OBJECTIVE IMPAIRMENTS: decreased activity tolerance, decreased mobility, decreased strength, increased fascial restrictions, impaired perceived functional ability, and pain.   ACTIVITY  LIMITATIONS: carrying, lifting, bending, sitting, standing, squatting, sleeping, stairs, bed mobility, locomotion level, and caring for others  PARTICIPATION LIMITATIONS: meal prep, cleaning, laundry, driving, shopping, community activity, and yard work  PERSONAL FACTORS:  legally blind  are also affecting patient's functional outcome.   REHAB POTENTIAL: Good  CLINICAL DECISION MAKING: Evolving/moderate complexity  EVALUATION COMPLEXITY: Moderate   GOALS: Goals reviewed with patient? No  SHORT TERM GOALS: Target date: 02/06/2024  patient will be independent with initial HEP  Baseline: Goal status: INITIAL  2.  Patient will report 30%  improvement overall  Baseline:  Goal status: INITIAL  LONG TERM GOALS: Target date: 02/28/2024  Patient will be independent in self management strategies to improve quality of life and functional outcomes.  Baseline:  Goal status: INITIAL  2.  Patient will report 50% improvement overall  Baseline:  Goal status: INITIAL  3.  Patient will improve Modified Oswestry score by 9  points to demonstrate improved perceived function  Baseline: 19/50 Goal status: INITIAL  4.   Patient will increase leg MMT's to 5/5 to allow navigation of steps without gait deviation or loss of balance  Baseline: see above Goal status: INITIAL  5.  Patient will demonstrate good lifting mechanics in order to lift her 25#  grand child safely.   Baseline:  Goal status: INITIAL  PLAN:  PT FREQUENCY: 1x/week  PT DURATION: 6 weeks  PLANNED INTERVENTIONS: 97164- PT Re-evaluation, 97110-Therapeutic exercises, 97530- Therapeutic activity, 97112- Neuromuscular re-education, 97535- Self Care, 52841- Manual therapy, [phone]- Gait training, [phone]- Orthotic Fit/training, [phone]- Canalith repositioning, [id number]- Aquatic Therapy, [phone]- Splinting, Patient/Family education, Balance training, Stair training, Taping, Dry Needling, Joint mobilization, Joint manipulation, Spinal manipulation, Spinal mobilization, Scar mobilization, and DME instructions. Aaron Aas  PLAN FOR NEXT SESSION: Review HEP and goals; patient is legally blind and cannot drive so will do 1 x a week due to transportation limitation.  Decompression exercises; proper body mechanics with lifting and with functional activities.  Postural strengthening   3:01 PM, 01/16/24 Marvin Grabill Small Rhianon Zabawa MPT [facility] [place] [phone] [phone]   Managed Medicaid Authorization Request  Visit Dx Codes: M54.16, M54.50  Functional Tool Score: Modified Oswestry 19/50 38%  For all possible CPT codes, reference the Planned Interventions line above.     Check  all conditions that are expected to impact treatment: {Conditions expected to impact treatment:None of these apply   If treatment provided at initial evaluation, no treatment charged due to lack of authorization.

## 2024-01-16 ENCOUNTER — Other Ambulatory Visit: Payer: Self-pay

## 2024-01-16 ENCOUNTER — Other Ambulatory Visit

## 2024-01-16 ENCOUNTER — Ambulatory Visit (HOSPITAL_COMMUNITY): Attending: Neurological Surgery

## 2024-01-16 ENCOUNTER — Encounter

## 2024-01-16 DIAGNOSIS — M545 Low back pain, unspecified: Secondary | ICD-10-CM | POA: Insufficient documentation

## 2024-01-16 DIAGNOSIS — M5416 Radiculopathy, lumbar region: Secondary | ICD-10-CM | POA: Insufficient documentation

## 2024-01-20 ENCOUNTER — Ambulatory Visit
Admission: RE | Admit: 2024-01-20 | Discharge: 2024-01-20 | Disposition: A | Discharge status: Home / Self Care | Source: Ambulatory Visit | Attending: Internal Medicine | Admitting: Internal Medicine

## 2024-01-20 ENCOUNTER — Ambulatory Visit

## 2024-01-20 DIAGNOSIS — R928 Other abnormal and inconclusive findings on diagnostic imaging of breast: Secondary | ICD-10-CM

## 2024-01-22 ENCOUNTER — Other Ambulatory Visit

## 2024-01-22 ENCOUNTER — Encounter

## 2024-01-23 ENCOUNTER — Ambulatory Visit (HOSPITAL_COMMUNITY): Attending: Neurological Surgery

## 2024-01-23 ENCOUNTER — Encounter (HOSPITAL_COMMUNITY): Payer: Self-pay

## 2024-01-23 DIAGNOSIS — M5416 Radiculopathy, lumbar region: Secondary | ICD-10-CM | POA: Diagnosis present

## 2024-01-23 DIAGNOSIS — M545 Low back pain, unspecified: Secondary | ICD-10-CM | POA: Diagnosis present

## 2024-01-23 NOTE — Therapy (Signed)
 OUTPATIENT PHYSICAL THERAPY THORACOLUMBAR TREATMENT   Patient Name: [facility] MRN: [id number] DOB:October 11, 1965, 58 y.o., female Today's Date: 01/23/2024  END OF SESSION:  PT End of Session - 01/23/24 1150     Visit Number 2    Number of Visits 6    Date for PT Re-Evaluation 02/28/24    Authorization Type [employer]    Progress Note Due on Visit 10    PT Start Time 1150    PT Stop Time 1232    PT Time Calculation (min) 42 min    Activity Tolerance Patient tolerated treatment well    Behavior During Therapy [facility] for tasks assessed/performed             Past Medical History:  Diagnosis Date   Anxiety    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   Depression    Hypertension    Legally blind    Panic attacks    Pneumonia    Past Surgical History:  Procedure Laterality Date   COLONOSCOPY     LUMBAR LAMINECTOMY/ DECOMPRESSION WITH MET-RX Right 08/27/2023   Procedure: Lumbar four-five Minimally Invasive Laminotomy, MICRODISCECTOMY;  Surgeon: Pincus Bridgeman, DO;  Location: [facility] OR;  Service: Neurosurgery;  Laterality: Right;   TUBAL LIGATION  1991   Patient Active Problem List   Diagnosis Date Noted   Lumbar radiculopathy, chronic 08/27/2023   GAD (generalized anxiety disorder) 10/24/2017   Moderate episode of recurrent major depressive disorder (HCC) 10/24/2017   Panic disorder 10/24/2017    PCP: Orlena Bitters, MD  REFERRING PROVIDER: Dawley, Colby Daub, DO  REFERRING DIAG: M54.16 (ICD-10-CM) - Lumbar radiculopathy  Rationale for Evaluation and Treatment: Rehabilitation  THERAPY DIAG:  Radiculopathy, lumbar region  Low back pain, unspecified back pain laterality, unspecified chronicity, unspecified whether sciatica present  ONSET DATE: s/p lumbar laminectomy 08/26/24  SUBJECTIVE:                                                                                                                                                                                            SUBJECTIVE STATEMENT: 01/23/24:  No reports of pain.  Reports increased pain last night with ADLs and increased difficulty sleeping, hard to find good position.    Sciatica down right leg; back trouble since she fell at [employer] 3 years ago.  Her PCP ordered an MRI showed a bulging disc referred to [facility]; had surgery 08/27/23 with expectation to alleviate leg pain.  Her leg pain is gone but she is still having back pain into bilateral low back; glutes.  Dawley ordered therapy for her; she has had to postpone her therapy  a few times due to her ride falling through  PERTINENT HISTORY:  Legally blind  PAIN:  Are you having pain? Yes: NPRS scale: 0/10 Pain location: low back both sides out to hips Pain description: radiating out Aggravating factors: standing, walking, lifting Relieving factors: rest,   PRECAUTIONS: None  RED FLAGS: None   WEIGHT BEARING RESTRICTIONS: No  FALLS:  Has patient fallen in last 6 months? No  LIVING ENVIRONMENT: Lives with: lives alone Lives in: House/apartment Stairs: Yes: External: 5 steps; on right going up, on left going up, and bilateral but cannot reach both Has following equipment at home:  none  OCCUPATION: on disability since 2019  PLOF: Independent  PATIENT GOALS:  get my back stronger; get back into exercise  NEXT MD VISIT: PRN  OBJECTIVE:  Note: Objective measures were completed at Evaluation unless otherwise noted.  DIAGNOSTIC FINDINGS:  CLINICAL DATA:  Fall 2 years ago. Low back pain radiating to the right leg and ankle.   EXAM: MRI LUMBAR SPINE WITHOUT CONTRAST   TECHNIQUE: Multiplanar, multisequence MR imaging of the lumbar spine was performed. No intravenous contrast was administered.   COMPARISON:  CT lumbar spine 06/01/2021   FINDINGS: Segmentation: Standard; the lowest formed disc space is designated L5-S1.   Alignment:  Normal.   Vertebrae: Vertebral body heights are preserved. Background marrow signal is normal.  There is a probable benign intraosseous hemangioma in the T12 vertebral body. Scattered small endplate Schmorl's nodes are noted. There is no suspicious marrow signal abnormality or marrow edema.   Conus medullaris and cauda equina: Conus extends to the L1-L2 level. Conus and cauda equina appear normal.   Paraspinal and other soft tissues: A 1.6 cm left renal lesion is unchanged, characterized on prior MR abdomen from 2022. The paraspinal soft tissues are unremarkable.   Disc levels:   There is mild disc desiccation and narrowing at L3-L4 through L5-S1.   T12-L1: There is a tiny central protrusion without significant spinal canal or neural foraminal stenosis.   L1-L2: Mild facet arthropathy without significant spinal canal or neural foraminal stenosis.   L2-L3: Mild facet arthropathy without significant spinal canal or neural foraminal stenosis.   L3-L4: There is a mild disc bulge and mild facet arthropathy without significant spinal canal or neural foraminal stenosis.   L4-L5: There is a mild disc bulge with a superimposed inferiorly migrated right subarticular zone extrusion and moderate facet arthropathy resulting in impingement of the traversing right L5 nerve root in the subarticular zone, and mild left and no significant right neural foraminal stenosis.   L5-S1: No significant spinal canal or neural foraminal stenosis.   IMPRESSION: 1. Inferiorly migrated right subarticular zone disc extrusion and moderate facet arthropathy at L4-L5 resulting in impingement of the traversing right L5 nerve root. Correlate with radicular symptoms. 2. Otherwise, mild degenerative changes as above without other evidence of nerve root impingement. 3. Left renal cysts better characterized on prior MRI abdomen from 2022. See follow up recommendations on that study.     CLINICAL DATA:  L4-5 MIS laminectomy   EXAM: LUMBAR SPINE - 1 VIEW   COMPARISON:  Lumbar MRI 07/03/2023.    FINDINGS: C-arm fluoroscopy was provided in the operating room without the presence of a radiologist.7.2 seconds fluoroscopy time. 6.24 mGy air kerma.   Two C-arm fluoroscopic images were obtained intraoperatively and are submitted for post operative interpretation. Submitted images demonstrate posterior localization of the L4-disc space and placement of a Metrx tube.   Please see intraoperative  findings for further detail.   IMPRESSION: Intraoperative fluoroscopic guidance for L4-5 laminectomy.      PATIENT SURVEYS:  Modified Oswestry 19/50 38%   COGNITION: Overall cognitive status: Within functional limits for tasks assessed     SENSATION: WFL   POSTURE: rounded shoulders and forward head  PALPATION: Tender right side PSIS and lumbar multifidus  LUMBAR ROM:   AROM eval  Flexion To ankles  Extension 25%  Right lateral flexion *3" above knee joint line  Left lateral flexion To knee joint line   Right rotation   Left rotation    (Blank rows = not tested)  LOWER EXTREMITY ROM:     Active  Right eval Left eval  Hip flexion    Hip extension    Hip abduction    Hip adduction    Hip internal rotation    Hip external rotation    Knee flexion    Knee extension    Ankle dorsiflexion    Ankle plantarflexion    Ankle inversion    Ankle eversion     (Blank rows = not tested)  LOWER EXTREMITY MMT:    MMT Right eval Left eval  Hip flexion 4+ 4+  Hip extension 4 4+  Hip abduction    Hip adduction    Hip internal rotation    Hip external rotation    Knee flexion 4 4+  Knee extension 4+ 5  Ankle dorsiflexion 5 5  Ankle plantarflexion    Ankle inversion    Ankle eversion     (Blank rows = not tested)   FUNCTIONAL TESTS:  5 times sit to stand: 9.23 sec using hands to assist  GAIT: Distance walked: 50 ft in clinic  Assistive device utilized: None Level of assistance: Modified independence Comments: no AD  TREATMENT DATE:  01/23/24: Reviewed  goals Educated importance of HEP compliance for maximal benefits Pt able to recall with min cueing for form  Supine:  Decompression Decompression with RTB Bridges 10x 5" STS 10x eccentric control  01/16/24 physical therapy evaluation and HEP instruction                                                                                                                                 PATIENT EDUCATION:  Education details: Patient educated on exam findings, POC, scope of PT, HEP, and what to expect next visit. Person educated: Patient Education method: Explanation, Demonstration, and Handouts Education comprehension: verbalized understanding, returned demonstration, verbal cues required, and tactile cues required  HOME EXERCISE PROGRAM: Decompression exercises 2-5  01/23/24: Access Code: [email or web address] URL: [email or web address] Date: 01/23/2024 Prepared by: Minor Amble  Exercises - Supine Bridge  - 2 x daily - 7 x weekly - 2 sets - 10 reps - 5" hold - Sit to Stand  - 2 x daily - 7 x weekly - 1 sets - 10 reps - Decompression with RTB  ASSESSMENT:  CLINICAL IMPRESSION: 01/23/24:  Reviewed  goals and educated importance of HEP compliance, pt reports she has began though some cueing required for proper form.  Pt educated on purpose of these exercises.  Educated importance of posture for pain control.  Progressed decompression exercises with additional theraband resistance for strengthening.  Added posterior chain strengthening exercises as well, pt tolerated well to session with no reports of pain through session.  Pt coming 1x a week, plan to add to HEP every session.  EOS pt points to Rt PSIS and stated this is area that usually hurts, check SI alignment next session.    Eval:  Patient is a 58 y.o. female who was seen today for physical therapy evaluation and treatment for M54.16 (ICD-10-CM) - Lumbar radiculopathy. Patient demonstrates muscle weakness, reduced ROM, and fascial  restrictions which are likely contributing to symptoms of pain and are negatively impacting patient ability to perform ADLs and functional mobility tasks. Patient will benefit from skilled physical therapy services to address these deficits to reduce pain and improve level of function with ADLs and functional mobility tasks.   OBJECTIVE IMPAIRMENTS: decreased activity tolerance, decreased mobility, decreased strength, increased fascial restrictions, impaired perceived functional ability, and pain.   ACTIVITY LIMITATIONS: carrying, lifting, bending, sitting, standing, squatting, sleeping, stairs, bed mobility, locomotion level, and caring for others  PARTICIPATION LIMITATIONS: meal prep, cleaning, laundry, driving, shopping, community activity, and yard work  PERSONAL FACTORS:  legally blind  are also affecting patient's functional outcome.   REHAB POTENTIAL: Good  CLINICAL DECISION MAKING: Evolving/moderate complexity  EVALUATION COMPLEXITY: Moderate   GOALS: Goals reviewed with patient? No  SHORT TERM GOALS: Target date: 02/06/2024  patient will be independent with initial HEP  Baseline: Goal status: INITIAL  2.  Patient will report 30% improvement overall  Baseline:  Goal status: INITIAL  LONG TERM GOALS: Target date: 02/28/2024  Patient will be independent in self management strategies to improve quality of life and functional outcomes.  Baseline:  Goal status: INITIAL  2.  Patient will report 50% improvement overall  Baseline:  Goal status: INITIAL  3.  Patient will improve Modified Oswestry score by 9  points to demonstrate improved perceived function  Baseline: 19/50 Goal status: INITIAL  4.   Patient will increase leg MMT's to 5/5 to allow navigation of steps without gait deviation or loss of balance  Baseline: see above Goal status: INITIAL  5.  Patient will demonstrate good lifting mechanics in order to lift her 25#  grand child safely.   Baseline:   Goal status: INITIAL  PLAN:  PT FREQUENCY: 1x/week  PT DURATION: 6 weeks  PLANNED INTERVENTIONS: 97164- PT Re-evaluation, 97110-Therapeutic exercises, 97530- Therapeutic activity, 97112- Neuromuscular re-education, 97535- Self Care, 16109- Manual therapy, [phone]- Gait training, [phone]- Orthotic Fit/training, [phone]- Canalith repositioning, [id number]- Aquatic Therapy, [phone]- Splinting, Patient/Family education, Balance training, Stair training, Taping, Dry Needling, Joint mobilization, Joint manipulation, Spinal manipulation, Spinal mobilization, Scar mobilization, and DME instructions. Aaron Aas  PLAN FOR NEXT SESSION: Patient is legally blind and cannot drive so will do 1 x a week due to transportation limitation.  Decompression exercises; proper body mechanics with lifting and with functional activities.  Postural strengthening.  Next session check SI, review decompression, progress to standing exercises.  Minor Amble, LPTA/CLT; CBIS [phone]  12:59 PM, 01/23/24

## 2024-01-28 ENCOUNTER — Ambulatory Visit: Admitting: Adult Health

## 2024-01-30 ENCOUNTER — Encounter (HOSPITAL_COMMUNITY)

## 2024-02-05 ENCOUNTER — Encounter (HOSPITAL_COMMUNITY)

## 2024-02-12 ENCOUNTER — Encounter (HOSPITAL_COMMUNITY)

## 2024-02-19 ENCOUNTER — Encounter (HOSPITAL_COMMUNITY): Admitting: Physical Therapy

## 2024-02-26 ENCOUNTER — Encounter (HOSPITAL_COMMUNITY)

## 2024-06-14 ENCOUNTER — Emergency Department (HOSPITAL_COMMUNITY)
Admission: EM | Admit: 2024-06-14 | Discharge: 2024-06-15 | Disposition: A | Discharge status: Home / Self Care | Attending: Emergency Medicine | Admitting: Emergency Medicine

## 2024-06-14 ENCOUNTER — Encounter (HOSPITAL_COMMUNITY): Payer: Self-pay

## 2024-06-14 ENCOUNTER — Other Ambulatory Visit: Payer: Self-pay

## 2024-06-14 DIAGNOSIS — Z860101 Personal history of adenomatous and serrated colon polyps: Secondary | ICD-10-CM | POA: Insufficient documentation

## 2024-06-14 DIAGNOSIS — T471X1A Poisoning by other antacids and anti-gastric-secretion drugs, accidental (unintentional), initial encounter: Secondary | ICD-10-CM | POA: Diagnosis present

## 2024-06-14 DIAGNOSIS — K5730 Diverticulosis of large intestine without perforation or abscess without bleeding: Secondary | ICD-10-CM | POA: Insufficient documentation

## 2024-06-14 DIAGNOSIS — Z79899 Other long term (current) drug therapy: Secondary | ICD-10-CM | POA: Insufficient documentation

## 2024-06-14 DIAGNOSIS — K5900 Constipation, unspecified: Secondary | ICD-10-CM | POA: Diagnosis not present

## 2024-06-14 DIAGNOSIS — T50901A Poisoning by unspecified drugs, medicaments and biological substances, accidental (unintentional), initial encounter: Secondary | ICD-10-CM

## 2024-06-14 LAB — CBC WITH DIFFERENTIAL/PLATELET
Abs Immature Granulocytes: 0.02 K/uL (ref 0.00–0.07)
Basophils Absolute: 0 K/uL (ref 0.0–0.1)
Basophils Relative: 1 %
Eosinophils Absolute: 0.1 K/uL (ref 0.0–0.5)
Eosinophils Relative: 2 %
HCT: 36.8 % (ref 36.0–46.0)
Hemoglobin: 12.8 g/dL (ref 12.0–15.0)
Immature Granulocytes: 0 %
Lymphocytes Relative: 39 %
Lymphs Abs: 2.1 K/uL (ref 0.7–4.0)
MCH: 32.2 pg (ref 26.0–34.0)
MCHC: 34.8 g/dL (ref 30.0–36.0)
MCV: 92.5 fL (ref 80.0–100.0)
Monocytes Absolute: 0.5 K/uL (ref 0.1–1.0)
Monocytes Relative: 9 %
Neutro Abs: 2.7 K/uL (ref 1.7–7.7)
Neutrophils Relative %: 49 %
Platelets: 231 K/uL (ref 150–400)
RBC: 3.98 MIL/uL (ref 3.87–5.11)
RDW: 13 % (ref 11.5–15.5)
WBC: 5.4 K/uL (ref 4.0–10.5)
nRBC: 0 % (ref 0.0–0.2)

## 2024-06-14 LAB — COMPREHENSIVE METABOLIC PANEL WITH GFR
ALT: 42 U/L (ref 0–44)
AST: 33 U/L (ref 15–41)
Albumin: 4.5 g/dL (ref 3.5–5.0)
Alkaline Phosphatase: 78 U/L (ref 38–126)
Anion gap: 13 (ref 5–15)
BUN: 21 mg/dL — ABNORMAL HIGH (ref 6–20)
CO2: 24 mmol/L (ref 22–32)
Calcium: 9.6 mg/dL (ref 8.9–10.3)
Chloride: 104 mmol/L (ref 98–111)
Creatinine, Ser: 0.71 mg/dL (ref 0.44–1.00)
GFR, Estimated: >60 mL/min (ref >60)
Glucose, Bld: 101 mg/dL — ABNORMAL HIGH (ref 70–99)
Potassium: 3.8 mmol/L (ref 3.5–5.1)
Sodium: 141 mmol/L (ref 135–145)
Total Bilirubin: 0.7 mg/dL (ref 0.0–1.2)
Total Protein: 8 g/dL (ref 6.5–8.1)

## 2024-06-14 LAB — ACETAMINOPHEN LEVEL: Acetaminophen (Tylenol), Serum: <10 ug/mL — ABNORMAL LOW (ref 10–30)

## 2024-06-14 LAB — MAGNESIUM: Magnesium: 2.5 mg/dL — ABNORMAL HIGH (ref 1.7–2.4)

## 2024-06-14 LAB — SALICYLATE LEVEL: Salicylate Lvl: <7 mg/dL — ABNORMAL LOW (ref 7.0–30.0)

## 2024-06-14 NOTE — ED Provider Notes (Signed)
 [facility] AT [facility] Provider Note   CSN: [id number] Arrival date & time: 06/14/24  2013     Patient presents with: Ingestion   Bonnie Warren is a 58 y.o. female.    Ingestion  This patient is a 58 year old female with a history of what she describes as some constipation, she reports that she has not had a bowel movement in a couple of days and she is legally blind, she had purchased some magnesium citrate Gummies from the store to help with her bowel movements and decided to take what she describes as 3 handfuls at 1 time.  She then was able to see the label and saw that it was a larger amount of magnesium at the amount of 200 mg, she became very concerned and called poison control who told her to come to the ER for 4 hours of observation, labs and gastrointestinal precautions.  She has no symptoms at this time, states that the ingestion was at approximately 8:15 PM     Prior to Admission medications   Medication Sig Start Date End Date Taking? Authorizing Provider  albuterol  (VENTOLIN  HFA) 108 (90 Base) MCG/ACT inhaler Inhale 2 puffs into the lungs every 6 (six) hours as needed. 12/25/23   Leath-Warren, Etta PARAS, NP  ALPRAZolam  (XANAX ) 0.5 MG tablet Take 0.5 mg by mouth at bedtime as needed for anxiety.    [provider]  brompheniramine-pseudoephedrine-DM 30-2-10 MG/5ML syrup Take 5 mLs by mouth 4 (four) times daily as needed. 12/25/23   Leath-Warren, Etta PARAS, NP  Cyanocobalamin (B-12 PO) Take 1 tablet by mouth daily.    [provider]  diazepam  (VALIUM ) 5 MG tablet Take 5 mg by mouth every 6 (six) hours as needed for anxiety. 03/06/22   [provider]  fluticasone  (FLONASE ) 50 MCG/ACT nasal spray Place 1 spray into both nostrils daily for 14 days. Patient not taking: Reported on 08/14/2023 07/11/20 01/21/23  Avegno, Komlanvi S, FNP  gemfibrozil  (LOPID ) 600 MG tablet Take 600 mg by mouth daily. 07/11/23    [provider]  hydrochlorothiazide  (HYDRODIURIL ) 12.5 MG tablet Take 12.5 mg by mouth daily. 07/03/22   [provider]  HYDROcodone -acetaminophen  (NORCO/VICODIN) 5-325 MG tablet Take 1 tablet by mouth every 4 (four) hours as needed for severe pain (pain score 7-10) or moderate pain (pain score 4-6). 08/27/23 08/26/24  Johnanna Credit Caylin, PA-C  lisinopril  (ZESTRIL ) 20 MG tablet Take 20 mg by mouth daily. 07/03/22   [provider]  methocarbamol  (ROBAXIN -750) 750 MG tablet Take 1 tablet (750 mg total) by mouth every 6 (six) hours as needed for muscle spasms. 08/27/23   Tomlinson, Sara Caylin, PA-C  montelukast  (SINGULAIR ) 10 MG tablet Take 1 tablet (10 mg total) by mouth at bedtime. 12/25/23   Leath-Warren, Etta PARAS, NP  Polyethyl Glycol-Propyl Glycol (SYSTANE OP) Place 1 drop into both eyes as needed (dry eyes).    [provider]  rosuvastatin  (CRESTOR ) 10 MG tablet Take 10 mg by mouth at bedtime. 05/09/21   [provider]  Vitamin D, Ergocalciferol, (DRISDOL) 1.25 MG (50000 UNIT) CAPS capsule Take 50,000 Units by mouth once a week. 07/30/23   [provider]  sertraline  (ZOLOFT ) 100 MG tablet Take 2 tablets (200 mg total) by mouth daily. 10/22/18 05/09/20  Vickey Mettle, MD    Allergies: Patient has no known allergies.    Review of Systems  All other systems reviewed and are negative.   Updated Vital Signs BP  131/83   Pulse 78   Temp 97.9 F (36.6 C)   Resp 20   SpO2 91%   Physical Exam Vitals and nursing note reviewed.  Constitutional:      General: She is not in acute distress.    Appearance: She is well-developed.  HENT:     Head: Normocephalic and atraumatic.     Mouth/Throat:     Pharynx: No oropharyngeal exudate.  Eyes:     General: No scleral icterus.       Right eye: No discharge.        Left eye: No discharge.     Conjunctiva/sclera: Conjunctivae normal.     Pupils: Pupils are equal, round, and reactive to  light.  Neck:     Thyroid: No thyromegaly.     Vascular: No JVD.  Cardiovascular:     Rate and Rhythm: Normal rate and regular rhythm.     Heart sounds: Normal heart sounds. No murmur heard.    No friction rub. No gallop.  Pulmonary:     Effort: Pulmonary effort is normal. No respiratory distress.     Breath sounds: Normal breath sounds. No wheezing or rales.  Abdominal:     General: Bowel sounds are normal. There is no distension.     Palpations: Abdomen is soft. There is no mass.     Tenderness: There is no abdominal tenderness.  Musculoskeletal:        General: No tenderness. Normal range of motion.     Cervical back: Normal range of motion and neck supple.     Right lower leg: No edema.     Left lower leg: No edema.  Lymphadenopathy:     Cervical: No cervical adenopathy.  Skin:    General: Skin is warm and dry.     Findings: No erythema or rash.  Neurological:     Mental Status: She is alert.     Coordination: Coordination normal.     Comments: Normal gait speech coordination  Psychiatric:        Behavior: Behavior normal.     (all labs ordered are listed, but only abnormal results are displayed) Labs Reviewed  ACETAMINOPHEN  LEVEL - Abnormal; Notable for the following components:      Result Value   Acetaminophen  (Tylenol ), Serum <10 (*)    All other components within normal limits  SALICYLATE LEVEL - Abnormal; Notable for the following components:   Salicylate Lvl <7.0 (*)    All other components within normal limits  COMPREHENSIVE METABOLIC PANEL WITH GFR - Abnormal; Notable for the following components:   Glucose, Bld 101 (*)    BUN 21 (*)    All other components within normal limits  MAGNESIUM - Abnormal; Notable for the following components:   Magnesium 2.5 (*)    All other components within normal limits  CBC WITH DIFFERENTIAL/PLATELET  MAGNESIUM    EKG: EKG Interpretation Date/Time:  Sunday June 14 2024 20:43:18 EDT Ventricular Rate:  79 PR  Interval:  180 QRS Duration:  83 QT Interval:  380 QTC Calculation: 436 R Axis:   54  Text Interpretation: Sinus rhythm Low voltage, precordial leads Confirmed by Cleotilde Rogue ([id number]) on 06/14/2024 8:47:34 PM  Radiology: No results found.   Procedures   Medications Ordered in the ED - No data to display  Medical Decision Making Amount and/or Complexity of Data Reviewed Labs: ordered. ECG/medicine tests: ordered.    This patient presents to the ED for concern of overdose, unintentional, this involves an extensive number of treatment options, and is a complaint that carries with it a high risk of complications and morbidity.  The differential diagnosis includes electrolyte disturbances, gastrointestinal upset, check EKG, cardiac monitoring, 4 to 6 hours of observation per poison control meeting patient will be cleared at approximately 1 AM   Co morbidities / Chronic conditions that complicate the patient evaluation  Constipation   Additional history obtained:  Additional history obtained from EMR External records from outside source obtained and reviewed including medical record and poison control recommendations   Lab Tests:  I Ordered, and personally interpreted labs.  The pertinent results include: Elysium level was 2.5 on arrival, this will be repeated at 1:00, metabolic panel and CBC unremarkable, no detectable acetaminophen  or salicylate   Cardiac Monitoring: / EKG:  The patient was maintained on a cardiac monitor.  I personally viewed and interpreted the cardiac monitored which showed an underlying rhythm of: Normal sinus rhythm   Problem List / ED Course / Critical interventions / Medication management  Patient will be signed out at change of shift to follow-up magnesium result and disposition accordingly, she is asymptomatic at the time of change of shift with normal vital signs  Consultations Obtained:  I requested  consultation with the [facility],  and discussed lab and imaging findings as well as pertinent plan - they recommend: Labs and observation for 46 hours   Social Determinants of Health:  Clinically blind   Test / Admission - Considered:  Anticipate discharge      Final diagnoses:  Accidental overdose, initial encounter    ED Discharge Orders     None          Cleotilde Rogue, MD 06/14/24 2323

## 2024-06-14 NOTE — ED Notes (Signed)
 Received call from Poison control prior to pt's arrival with recommendations for treatment as follows: Check CMP and Magnesium levels, supportive therapy as needed for GI issues, monitor for 4-6 hrs. Dr Cleotilde notified.

## 2024-06-14 NOTE — ED Provider Notes (Signed)
 Care assumed form Dr. Cleotilde, patient with accidental overdose of magnesium. Repeat magnesium level is pending, needs to be observed until 0100.  Repeat magnesium level is unchanged.  Patient has been resting comfortably with no evidence of any toxic effects.  She is stable for discharge.   Raford Lenis, MD 06/15/24 [phone]

## 2024-06-14 NOTE — ED Notes (Signed)
 Patient given ice water to drink. Patient has no complaints

## 2024-06-14 NOTE — ED Triage Notes (Addendum)
 Pt from home, complains of taking too much magnesium citrate. Pt called poison control and told to come in. Pt states she legally blind and didn't mean to take more than needed. Pt AAOx4 and Ambulatory. Denies CP and SOB. Last BM 2 days ago. Taken about 15 mins  pta

## 2024-06-15 LAB — MAGNESIUM: Magnesium: 2.5 mg/dL — ABNORMAL HIGH (ref 1.7–2.4)

## 2024-06-15 NOTE — Discharge Instructions (Signed)
 Expect to have abdominal cramping and diarrhea tomorrow as the magnesium works through your system.  However, there is no evidence of toxic ingestion.  Return to the emergency department if you are having any problems.
